# Patient Record
Sex: Female | Born: 1990 | Hispanic: Yes | Marital: Single | Smoking: Never smoker
Health system: Southern US, Community
[De-identification: ages and names within clinical notes are randomized; demographics above are authoritative.]

## PROBLEM LIST (undated history)

## (undated) DIAGNOSIS — O24419 Gestational diabetes mellitus in pregnancy, unspecified control: Secondary | ICD-10-CM

## (undated) HISTORY — DX: Gestational diabetes mellitus in pregnancy, unspecified control: O24.419

---

## 2014-12-03 ENCOUNTER — Other Ambulatory Visit (HOSPITAL_COMMUNITY): Payer: Self-pay | Admitting: Nurse Practitioner

## 2014-12-03 DIAGNOSIS — Z0489 Encounter for examination and observation for other specified reasons: Secondary | ICD-10-CM

## 2014-12-03 DIAGNOSIS — IMO0002 Reserved for concepts with insufficient information to code with codable children: Secondary | ICD-10-CM

## 2014-12-03 LAB — OB RESULTS CONSOLE GC/CHLAMYDIA
CHLAMYDIA, DNA PROBE: NEGATIVE
Gonorrhea: NEGATIVE

## 2014-12-03 LAB — OB RESULTS CONSOLE PLATELET COUNT: PLATELETS: 221 10*3/uL

## 2014-12-03 LAB — OB RESULTS CONSOLE HGB/HCT, BLOOD
HCT: 39 %
HEMOGLOBIN: 12.3 g/dL

## 2014-12-03 LAB — OB RESULTS CONSOLE RPR: RPR: NONREACTIVE

## 2014-12-03 LAB — GLUCOSE TOLERANCE, 1 HOUR (50G) W/O FASTING: GLUCOSE 1 HOUR GTT: 199 mg/dL (ref ?–200)

## 2014-12-03 LAB — OB RESULTS CONSOLE RUBELLA ANTIBODY, IGM: RUBELLA: IMMUNE

## 2014-12-03 LAB — OB RESULTS CONSOLE HIV ANTIBODY (ROUTINE TESTING): HIV: NONREACTIVE

## 2014-12-03 LAB — OB RESULTS CONSOLE ABO/RH: RH Type: POSITIVE

## 2014-12-03 LAB — OB RESULTS CONSOLE ANTIBODY SCREEN: Antibody Screen: NEGATIVE

## 2014-12-03 LAB — CYTOLOGY - PAP: Pap: NEGATIVE

## 2014-12-03 LAB — OB RESULTS CONSOLE HEPATITIS B SURFACE ANTIGEN: HEP B S AG: NEGATIVE

## 2014-12-03 LAB — SICKLE CELL SCREEN: SICKLE CELL SCREEN: NORMAL

## 2014-12-04 ENCOUNTER — Ambulatory Visit (HOSPITAL_COMMUNITY)
Admission: RE | Admit: 2014-12-04 | Discharge: 2014-12-04 | Disposition: A | Discharge status: Home / Self Care | Payer: Self-pay | Source: Ambulatory Visit | Attending: Nurse Practitioner | Admitting: Nurse Practitioner

## 2014-12-04 DIAGNOSIS — IMO0002 Reserved for concepts with insufficient information to code with codable children: Secondary | ICD-10-CM

## 2014-12-04 DIAGNOSIS — O34219 Maternal care for unspecified type scar from previous cesarean delivery: Secondary | ICD-10-CM | POA: Insufficient documentation

## 2014-12-04 DIAGNOSIS — Z36 Encounter for antenatal screening of mother: Secondary | ICD-10-CM | POA: Insufficient documentation

## 2014-12-04 DIAGNOSIS — Z3A19 19 weeks gestation of pregnancy: Secondary | ICD-10-CM | POA: Insufficient documentation

## 2014-12-04 DIAGNOSIS — Z0489 Encounter for examination and observation for other specified reasons: Secondary | ICD-10-CM

## 2014-12-04 DIAGNOSIS — Z3689 Encounter for other specified antenatal screening: Secondary | ICD-10-CM | POA: Insufficient documentation

## 2014-12-17 ENCOUNTER — Encounter: Payer: Self-pay | Attending: Advanced Practice Midwife | Admitting: *Deleted

## 2014-12-17 ENCOUNTER — Ambulatory Visit (INDEPENDENT_AMBULATORY_CARE_PROVIDER_SITE_OTHER): Payer: Self-pay | Admitting: *Deleted

## 2014-12-17 VITALS — Ht 61.63 in | Wt 189.5 lb

## 2014-12-17 DIAGNOSIS — Z3A00 Weeks of gestation of pregnancy not specified: Secondary | ICD-10-CM | POA: Insufficient documentation

## 2014-12-17 DIAGNOSIS — E119 Type 2 diabetes mellitus without complications: Secondary | ICD-10-CM | POA: Insufficient documentation

## 2014-12-17 DIAGNOSIS — O24912 Unspecified diabetes mellitus in pregnancy, second trimester: Secondary | ICD-10-CM | POA: Insufficient documentation

## 2014-12-17 DIAGNOSIS — Z6835 Body mass index (BMI) 35.0-35.9, adult: Secondary | ICD-10-CM | POA: Insufficient documentation

## 2014-12-17 DIAGNOSIS — O24410 Gestational diabetes mellitus in pregnancy, diet controlled: Secondary | ICD-10-CM

## 2014-12-17 DIAGNOSIS — Z713 Dietary counseling and surveillance: Secondary | ICD-10-CM | POA: Insufficient documentation

## 2014-12-17 LAB — POCT URINALYSIS DIP (DEVICE)
Bilirubin Urine: NEGATIVE
GLUCOSE, UA: NEGATIVE mg/dL
Ketones, ur: NEGATIVE mg/dL
NITRITE: NEGATIVE
Protein, ur: 30 mg/dL — AB
Specific Gravity, Urine: >=1.03 (ref 1.005–1.030)
Urobilinogen, UA: 0.2 mg/dL (ref 0.0–1.0)
pH: 5.5 (ref 5.0–8.0)

## 2014-12-17 NOTE — Progress Notes (Signed)
  Patient was seen on 12/17/14 for Gestational Diabetes self-management . The following learning objectives were met by the patient :   States the definition of Gestational Diabetes  States when to check blood glucose levels  Demonstrates proper blood glucose monitoring techniques  States the effect of stress and exercise on blood glucose levels  Plan:  Aim for 2 Carb Choices per meal (30 grams) +/- 1 either way for breakfast Aim for 3 Carb Choices per meal (45 grams) +/- 1 either way from lunch and dinner Aim for 1-2 Carbs per snack Begin reading food labels for Total Carbohydrate and sugar grams of foods Consider  increasing your activity level by walking daily as tolerated Begin checking BG before breakfast and 2 hours after first bit of breakfast, lunch and dinner after  as directed by MD  Take medication  as directed by MD  Blood glucose monitor given: True Track  Blood glucose reading: 120 2hpp  Patient instructed to monitor glucose levels: FBS: 60 - <90 2 hour: <120  Patient received the following handouts:  Nutrition Diabetes and Pregnancy  Patient will be seen for follow-up as needed.

## 2014-12-17 NOTE — Progress Notes (Signed)
Nutrition note: GDM diet education Pt was recently diagnosed with GDM & has h/o obesity. Pt has lost 0.5# @ 25wks (prepregancy wt: 190#) While discussing GDM diet, discovered that pt cannot read so interpreter helped by using pictures to help pt to know how many CHO servings she can have at each meal. Pt reports eating 2 meals/d & snacks occ. Pt reports no N&V but has some heartburn. NKFA. Pt reports no walking or physical activity. Pt received verbal & written education in Spanish via an interpreter about GDM diet. Discussed tips to decrease heartburn. Discussed wt gain goals of 11-20# or 0.5#/wk. Pt agrees to follow GDM diet with 3 meals & 1-3 snacks/d with proper CHO/ protein combination. Pt has WIC & plans to BF. F/u in 2-4 wks Blondell RevealLaura Ura Hausen, MS, RD, LDN, [facility]

## 2014-12-18 ENCOUNTER — Encounter: Payer: Self-pay | Admitting: *Deleted

## 2014-12-24 ENCOUNTER — Ambulatory Visit (INDEPENDENT_AMBULATORY_CARE_PROVIDER_SITE_OTHER): Payer: Self-pay | Admitting: Advanced Practice Midwife

## 2014-12-24 ENCOUNTER — Encounter: Payer: Self-pay | Admitting: Advanced Practice Midwife

## 2014-12-24 VITALS — BP 113/64 | HR 68 | Temp 98.4°F | Wt 188.7 lb

## 2014-12-24 DIAGNOSIS — O34219 Maternal care for unspecified type scar from previous cesarean delivery: Secondary | ICD-10-CM

## 2014-12-24 DIAGNOSIS — O24912 Unspecified diabetes mellitus in pregnancy, second trimester: Secondary | ICD-10-CM

## 2014-12-24 DIAGNOSIS — O3421 Maternal care for scar from previous cesarean delivery: Secondary | ICD-10-CM

## 2014-12-24 DIAGNOSIS — Z550 Illiteracy and low-level literacy: Secondary | ICD-10-CM

## 2014-12-24 DIAGNOSIS — R4789 Other speech disturbances: Secondary | ICD-10-CM

## 2014-12-24 DIAGNOSIS — N841 Polyp of cervix uteri: Secondary | ICD-10-CM

## 2014-12-24 DIAGNOSIS — Z3492 Encounter for supervision of normal pregnancy, unspecified, second trimester: Secondary | ICD-10-CM

## 2014-12-24 DIAGNOSIS — IMO0001 Reserved for inherently not codable concepts without codable children: Secondary | ICD-10-CM

## 2014-12-24 DIAGNOSIS — O24913 Unspecified diabetes mellitus in pregnancy, third trimester: Secondary | ICD-10-CM | POA: Insufficient documentation

## 2014-12-24 DIAGNOSIS — O09892 Supervision of other high risk pregnancies, second trimester: Secondary | ICD-10-CM

## 2014-12-24 LAB — POCT URINALYSIS DIP (DEVICE)
Bilirubin Urine: NEGATIVE
Glucose, UA: NEGATIVE mg/dL
Ketones, ur: NEGATIVE mg/dL
Nitrite: POSITIVE — AB
Protein, ur: NEGATIVE mg/dL
Specific Gravity, Urine: 1.025 (ref 1.005–1.030)
Urobilinogen, UA: 0.2 mg/dL (ref 0.0–1.0)
pH: 6 (ref 5.0–8.0)

## 2014-12-24 MED ORDER — GLYBURIDE 2.5 MG PO TABS
2.5000 mg | ORAL_TABLET | Freq: Two times a day (BID) | ORAL | Status: DC
Start: 2014-12-24 — End: 2015-01-28

## 2014-12-24 NOTE — Progress Notes (Signed)
Nutrition note: GDM diet f/u Pt has lost 1.3# @ 287w5d. Pt has been checking her BS- fasting: 79-95, 2hr pp: 96-164. Pt reports that she has noticed sometimes when her BS are high, she can think back & she ate more than she should have of CHO foods but other times she eats like we discussed last week and they're still high. Pt stated she has been measuring her foods (ie, rice & beans) but when asked which measuring cup was for rice, pt pointed to 1/2c instead of 1/3c as was discussed last week. Pt is starting Glyburide today. Interpreter helped review GDM diet & correct portion sizes. Pt reports no other questions. F/u in 2-4 wks Blondell RevealLaura Nycole Kawahara, MS, RD, LDN, [facility]

## 2014-12-24 NOTE — Progress Notes (Signed)
Initial [facility] appointment[facility] Rebecca Holder Spanish Interpreter Breastfeeding tip of the week reviewed Pt states she is very itchy Hgb: trace, Nitrites: positive, Leukocytes: small

## 2014-12-24 NOTE — Patient Instructions (Addendum)
Cortisone cream for itching. If you have itching and pealing between your toes got medicine for athelete's foot.   Type 1 or Type 2 Diabetes Mellitus During Pregnancy Diabetes mellitus, often simply referred to as diabetes, is a long-term (chronic) disease. Type 1 diabetes occurs when the islet cells, which are in the pancreas and make the hormone insulin, are destroyed and can no longer make insulin. Type 2 diabetes occurs when the pancreas does not make enough insulin, the cells are less responsive to the insulin that is made (insulin resistance), or both. Insulin is needed to move sugars from food into the tissue cells. The tissue cells use the sugars for energy. The lack of insulin or the lack of normal response to insulin causes excess sugars to build up in the blood instead of going into the tissue cells. As a result, high blood sugar (hyperglycemia) develops.  If blood glucose levels are kept in the normal range both before and during pregnancy, women can have a healthy pregnancy. If your blood glucose levels are not well controlled, there may be risks to you, your unborn baby, and your labor and delivery. Also, there may be risks to your baby once he or she is born.  RISK FACTORS  You are predisposed to developing type 1 diabetes if someone in your family has diabetes and you are exposed to certain environmental triggers.  You have an increased chance of developing type 2 diabetes if you have a family history of diabetes and also have one or more of the following risk factors:  Being overweight.  Having an inactive lifestyle.  Having a history of consistently eating high-calorie foods. SYMPTOMS  Increased thirst (polydipsia).  Increased urination (polyuria).  Increased urination during the night (nocturia).  Weight loss. This weight loss may be rapid.  Frequent, recurring infections.  Tiredness (fatigue).  Weakness.  Vision changes, such as blurred vision.  Fruity smell to  your breath.  Abdominal pain.  Nausea or vomiting. DIAGNOSIS  Diabetes is diagnosed when blood glucose levels are increased. Your blood glucose level may be checked by one or more of the following blood tests:  A fasting blood glucose test. You will not be allowed to eat for at least 8 hours before a blood sample is taken.  A random blood glucose test. Your blood glucose is checked at any time of the day regardless of when you ate.  A hemoglobin A1c blood glucose test. A hemoglobin A1c test provides information about blood glucose control over the previous 3 months.  An oral glucose tolerance test (OGTT). Your blood glucose is measured after you have not eaten (fasted) for 1-3 hours and then after you drink a glucose-containing beverage. An OGTT is usually performed during weeks 24-28 of your pregnancy. TREATMENT   You will need to take diabetes medicine or insulin daily to keep blood glucose levels in the desired range.  You will need to match insulin dosing with exercise and healthy food choices. The treatment goal is to maintain the before-meal (preprandial), bedtime, and overnight blood glucose level at 60-99 mg/dL during pregnancy. The treatment goal is to further maintain the peak after-meal blood sugar (postprandial glucose) level at 100-140 mg/dL.  HOME CARE INSTRUCTIONS   Have your hemoglobin A1c level checked twice a year.  Perform daily blood glucose monitoring as directed by your health care provider. It is common to perform frequent blood glucose monitoring.  Monitor urine ketones when you are sick and as directed by your health care  provider.  Take your diabetes medicine and insulin as directed by your health care provider to maintain your blood glucose level in the desired range.  Never run out of diabetes medicine or insulin. It is needed every day.  Adjust insulin based on your intake of carbohydrates. Carbohydrates can raise blood glucose levels but need to be  included in your diet. Carbohydrates provide vitamins, minerals, and fiber, which are an essential part of a healthy diet. Carbohydrates are found in fruits, vegetables, whole grains, dairy products, legumes, and foods containing added sugars.  Eat healthy foods. Alternate 3 meals with 3 snacks.  Maintain a healthy weight gain. The usual total expected weight gain varies according to your prepregnancy body mass index (BMI).  Carry a medical alert card or wear medical alert jewelry.  Carry a 15-gram carbohydrate snack with you at all times to treat low blood sugar (hypoglycemia). Some examples of 15-gram carbohydrate snacks include:  Glucose tablets, 3 or 4.  Glucose gel, 15-gram tube.  Raisins, 2 Tbsp (24 grams).  Jelly beans, 6.  Animal crackers, 8.  Fruit juice, regular soda, or low-fat milk, 4 ounces (120 mL).  Gummy treats, 9.  Recognize hypoglycemia. Hypoglycemia during pregnancy occurs with blood glucose levels of 60 mg/dL and below. The risk for hypoglycemia increases when fasting or skipping meals, during or after intense exercise, and during sleep. Hypoglycemia symptoms can include:  Tremors or shakes.  Decreased ability to concentrate.  Sweating.  Increased heart rate.  Headache.  Dry mouth.  Hunger.  Irritability.  Anxiety.  Restless sleep.  Altered speech or coordination.  Confusion.  Treat hypoglycemia promptly. If you are alert and able to safely swallow, follow the 15:15 rule:  Take 15-20 grams of rapid-acting glucose or carbohydrate. Rapid-acting options include glucose gel, glucose tablets, or 4 ounces (120 mL) of fruit juice, regular soda, or low-fat milk.  Check your blood glucose level 15 minutes after taking the glucose.  Take an additional 15-20 grams of glucose if the repeat blood glucose level is still 70 mg/dL or below.  Eat a meal or snack within 1 hour once blood glucose levels return to normal.  Engage in at least 30 minutes of  physical activity a day or as directed by your health care provider. Ten minutes of physical activity timed 30 minutes after each meal is encouraged to control postprandial blood glucose levels.  Watch for polyuria (excess urination) and polydipsia (feeling extra thirsty), which are early signs of hyperglycemia. An early awareness of hyperglycemia allows for prompt treatment. Treat hyperglycemia as directed by your health care provider.  Adjust your insulin dosing and food intake, as needed, if you start a new exercise or sport.  Follow your sick-day plan any time you are unable to eat or drink as usual.  Avoid tobacco and alcohol use.  Keep all follow-up visits as directed by your health care provider.  Follow the advice of your health care provider regarding your prenatal and post-delivery (postpartum) appointments, meal planning, exercise, medicines, vitamins, blood tests, other medical tests, and physical activities.  Continue daily skin and foot care. Examine your skin and feet daily for cuts, bruises, redness, nail problems, bleeding, blisters, or sores. A foot exam by a health care provider should be done annually.  Brush your teeth and gums at least twice a day and floss at least once a day. Follow up with your dentist regularly.  Schedule an eye exam during the first trimester of your pregnancy or as directed by  your health care provider.  Share your diabetes management plan with your workplace or school.  Stay up-to-date with immunizations.  Learn to manage stress.  Obtain ongoing diabetes education and support as needed.  Your health care provider may recommend that you take one low-dose aspirin (81 mg) each day to help prevent high blood pressure during your pregnancy (preeclampsia or eclampsia). You may be at risk for preeclampsia or eclampsia if:  You had preeclampsia or eclampsia during a previous pregnancy.  Your baby did not grow as expected during a previous  pregnancy.  You experienced preterm birth with a previous pregnancy.  You experienced a separation of the placenta from the uterus (placental abruption) during a previous pregnancy.  You experienced the loss of your baby during a previous pregnancy.  You are pregnant with more than one baby.  You have other medical conditions, such as high blood pressure or autoimmune disease. SEEK MEDICAL CARE IF:   You are unable to eat food or drink fluids for more than 6 hours.  You have nausea and vomiting for more than 6 hours.  You have a blood glucose level of 200 mg/dL and you have ketones in your urine.  There is a change in mental status.  You develop vision problems.  You have a persistent headache.  You have upper abdominal pain or discomfort.  You have an additional serious sickness.  You have diarrhea for more than 6 hours.  You have been sick or have had a fever for 2 days and are not getting better. SEEK IMMEDIATE MEDICAL CARE IF:  You have difficulty breathing.  You no longer feel your baby moving.  You are bleeding or have discharge from your vagina.  You start having premature contractions or labor. MAKE SURE YOU:  Understand these instructions.  Will watch your condition.  Will get help right away if you are not doing well or get worse. Document Released: 02/17/2012 Document Revised: 10/09/2013 Document Reviewed: 02/17/2012 [facility] Patient Information 2015 [employer] [place]. This information is not intended to replace advice given to you by your health care provider. Make sure you discuss any questions you have with your health care provider.

## 2014-12-26 DIAGNOSIS — Z550 Illiteracy and low-level literacy: Secondary | ICD-10-CM | POA: Insufficient documentation

## 2014-12-26 DIAGNOSIS — N841 Polyp of cervix uteri: Secondary | ICD-10-CM | POA: Insufficient documentation

## 2014-12-26 DIAGNOSIS — O09899 Supervision of other high risk pregnancies, unspecified trimester: Secondary | ICD-10-CM | POA: Insufficient documentation

## 2014-12-26 DIAGNOSIS — Z789 Other specified health status: Secondary | ICD-10-CM | POA: Insufficient documentation

## 2014-12-26 DIAGNOSIS — IMO0001 Reserved for inherently not codable concepts without codable children: Secondary | ICD-10-CM | POA: Insufficient documentation

## 2014-12-26 NOTE — Progress Notes (Signed)
   Subjective:    Rebecca Holder is a G2P1001 2439w0d being seen today [facility]. Started blood sugar testing.  Her obstetrical history is significant for obesity and C/S. Was told "baby couldn't fit". 6 or 7 lb.  Patient does intend to breast feed. Pregnancy history fully reviewed.  Patient reports no complaints.  Filed Vitals:   12/24/14 1105  BP: 113/64  Pulse: 68  Temp: 98.4 F (36.9 C)  Weight: 188 lb 11.2 oz (85.594 kg)    HISTORY: OB History  Gravida Para Term Preterm AB SAB TAB Ectopic Multiple Living  2 1 1  0 0 0 0 0 0 1    # Outcome Date GA Lbr Len/2nd Weight Sex Delivery Anes PTL Lv  2 Current           1 Term 03/25/07 52103w0d  7 lb (3.175 kg)  CS-Unspec  N      Past Medical History  Diagnosis Date  . Gestational diabetes    Past Surgical History  Procedure Laterality Date  . Cesarean section     No family history on file.  Exam  Fasting CBGs 1/2 abnormal PC CBG's, > 1/2 > 120   Uterus:   22 cm  Pelvic Exam: Exam deferred                           Bony Pelvis: unproven  System: Breast:  deferred   Skin: normal coloration and turgor, no rashes    Neurologic: oriented, normal, gait normal; reflexes normal and symmetric, grossly non-focal   Extremities: normal strength, tone, and muscle mass, no edema   HEENT sclera clear, anicteric, neck supple with midline trachea and thyroid without masses   Mouth/Teeth mucous membranes moist, pharynx normal without lesions and dental hygiene good   Neck supple and no masses   Cardiovascular: regular rate and rhythm, no murmurs or gallops   Respiratory:  appears well, vitals normal, no respiratory distress, acyanotic, normal RR, chest clear, no wheezing, crepitations, rhonchi, normal symmetric air entry   Abdomen: soft, non-tender; bowel sounds normal; no masses,  no organomegaly and gravid, S=D   Urinary: deferred      Assessment:    Pregnancy: G2P1001 Patient Active Problem List   Diagnosis Date Noted  . Diabetes mellitus affecting pregnancy in second trimester, antepartum 12/24/2014  . [redacted] weeks gestation of pregnancy   . Encounter for fetal anatomic survey   . Previous cesarean delivery, antepartum         Plan:     Initial labs reviewed. Prenatal vitamins. Problem list reviewed and updated. Genetic Screening discussed Quad Screen: ordered  Ultrasound discussed; fetal survey: ordered.  Follow up in 1 weeks. Start Glyburide 2.5 mg PO BID.  F/U 1 week to check CBGs  Dorathy KinsmanSMITH, Pasqualino Witherspoon 12/26/2014

## 2014-12-27 LAB — AFP, QUAD SCREEN
AFP: 80.7 ng/mL
Age Alone: 1:978 {titer}
CURR GEST AGE: 21.6 wks.days
HCG, Total: 3.22 IU/mL
INH: 158.5 pg/mL
Interpretation-AFP: NEGATIVE
MOM FOR HCG: 0.22
MoM for AFP: 1.27
MoM for INH: 1.02
Open Spina bifida: NEGATIVE
Osb Risk: 1:5570 {titer}
TRI 18 SCR RISK EST: NEGATIVE
uE3 Mom: 1.35
uE3 Value: 3.55 ng/mL

## 2015-01-10 ENCOUNTER — Ambulatory Visit
Admission: RE | Admit: 2015-01-10 | Discharge: 2015-01-10 | Disposition: A | Discharge status: Home / Self Care | Payer: No Typology Code available for payment source | Source: Ambulatory Visit | Attending: Infectious Disease | Admitting: Infectious Disease

## 2015-01-10 ENCOUNTER — Other Ambulatory Visit: Payer: Self-pay | Admitting: Infectious Disease

## 2015-01-10 DIAGNOSIS — R7611 Nonspecific reaction to tuberculin skin test without active tuberculosis: Secondary | ICD-10-CM

## 2015-01-14 ENCOUNTER — Ambulatory Visit (INDEPENDENT_AMBULATORY_CARE_PROVIDER_SITE_OTHER): Payer: Self-pay | Admitting: Obstetrics & Gynecology

## 2015-01-14 ENCOUNTER — Telehealth: Payer: Self-pay | Admitting: General Practice

## 2015-01-14 VITALS — BP 111/74 | HR 78 | Temp 98.0°F | Wt 187.3 lb

## 2015-01-14 DIAGNOSIS — O24912 Unspecified diabetes mellitus in pregnancy, second trimester: Secondary | ICD-10-CM

## 2015-01-14 DIAGNOSIS — O3421 Maternal care for scar from previous cesarean delivery: Secondary | ICD-10-CM

## 2015-01-14 DIAGNOSIS — O34219 Maternal care for unspecified type scar from previous cesarean delivery: Secondary | ICD-10-CM

## 2015-01-14 LAB — POCT URINALYSIS DIP (DEVICE)
Bilirubin Urine: NEGATIVE
Glucose, UA: 100 mg/dL — AB
HGB URINE DIPSTICK: NEGATIVE
NITRITE: NEGATIVE
Protein, ur: 30 mg/dL — AB
Specific Gravity, Urine: 1.025 (ref 1.005–1.030)
Urobilinogen, UA: 1 mg/dL (ref 0.0–1.0)
pH: 6 (ref 5.0–8.0)

## 2015-01-14 MED ORDER — ASPIRIN EC 81 MG PO TBEC: 81.0000 mg | DELAYED_RELEASE_TABLET | Freq: Every day | ORAL | Status: DC

## 2015-01-14 NOTE — Progress Notes (Signed)
Fetal echo scheduled 9/15

## 2015-01-14 NOTE — Telephone Encounter (Signed)
Patient left clinic before fetal echo appt was made. appt made for 9/15 @ 10am at [facility]. Called patient with [employer]arl Pikes for interpreter, no answer- left message stating we are trying to reach you in regards to an appt we have set up, please call us back at the clinics

## 2015-01-14 NOTE — Progress Notes (Signed)
Counseled about TOLAC  Subjective:brought her BG book  Rebecca Holder is a 24 y.o. G2P1001 at [redacted]w[redacted]d being seen today for ongoing prenatal care.  Patient reports no complaints.  Contractions: Not present.  Vag. Bleeding: None. Movement: Present. Denies leaking of fluid.   The following portions of the patient's history were reviewed and updated as appropriate: allergies, current medications, past family history, past medical history, past social history, past surgical history and problem list.   Objective:   Filed Vitals:   01/14/15 1131  BP: 111/74  Pulse: 78  Temp: 98 F (36.7 C)  Weight: 187 lb 4.8 oz (84.959 kg)    Fetal Status: Fetal Heart Rate (bpm): 147   Movement: Present     General:  Alert, oriented and cooperative. Patient is in no acute distress.  Skin: Skin is warm and dry. No rash noted.   Cardiovascular: Normal heart rate noted  Respiratory: Normal respiratory effort, no problems with respiration noted  Abdomen: Soft, gravid, appropriate for gestational age. Pain/Pressure: Absent     Pelvic: Vag. Bleeding: None     Cervical exam deferred        Extremities: Normal range of motion.  Edema: None  Mental Status: Normal mood and affect. Normal behavior. Normal judgment and thought content.   Urinalysis:      Assessment and Plan:  Pregnancy: G2P1001 at [redacted]w[redacted]d  1. Diabetes mellitus affecting pregnancy in second trimester, antepartum FBS most < 90, on PP 288 O/W <140, continue glyburide  Needs fetal echocardiogram. ASA 81 mg  [place] in 1 week 2. Previous cesarean delivery, antepartum TOLAC info given  Preterm labor symptoms and general obstetric precautions including but not limited to vaginal bleeding, contractions, leaking of fluid and fetal movement were reviewed in detail with the patient. Please refer to After Visit Summary for other counseling recommendations.  2 weeks f/u  Adam Phenix, MD

## 2015-01-14 NOTE — Patient Instructions (Addendum)
Diabetes mellitus tipo 1 o tipo 2 durante el embarazo (Type 1 or Type 2 Diabetes Mellitus During Pregnancy) La diabetes mellitus, generalmente denominada diabetes, es una enfermedad prolongada (crnica). La diabetes tipo1 ocurre cuando las clulas de los islotes, que estn en el pncreas y producen la hormona [place], se destruyen y ya no pueden producir insulina. La diabetes tipo2 ocurre cuando el pncreas no produce suficiente insulina, las clulas son menos sensibles a la insulina que se produce (resistencia a la insulina), o ambos. La insulina es necesaria para movilizar los azcares de los alimentos a las clulas de los tejidos. Las clulas de los tejidos [employer] azcares para Dealer. La falta de insulina o la falta de una respuesta normal a la insulina hace que el exceso de azcar se acumule en la sangre en lugar de Location manager en las clulas de los tejidos. Como resultado, se producen niveles altos de Dispensing optician (hiperglucemia).  Si se mantienen los niveles de glucosa en la sangre en un rango normal antes y Doctor, general practice [place], las mujeres pueden tener un embarazo saludable. Si no se controlan los niveles de glucosa en la sangre [place], puede haber riesgos para usted, para el beb que no ha nacido y [place] de parto y [place]. Tambin puede haber riesgos para el beb cuando nazca.  FACTORES DE RIESGO   Una persona est predispuesta a desarrollar diabetes tipo 1 si alguien en su familia padece la enfermedad y se expone a ciertos desencadenantes ambientales adicionales.  Tiene una mayor probabilidad de desarrollar diabetes tipo 2 si tiene antecedentes familiares de diabetes y tambin tiene uno o ms de los siguientes factores de riesgo:  Tener sobrepeso.  Tiene un estilo de vida sedentario.  Ha consumido, a lo [place] de toda su vida, una gran cantidad de alimentos con muchas caloras. SNTOMAS  Aumento de la sed (polidipsia).  Aumento de la miccin  (poliuria).  Orina con ms frecuencia durante la noche (nocturia).  Prdida de peso. La prdida de peso puede ser muy rpida.  Infecciones frecuentes y recurrentes.  Cansancio (fatiga).  Debilidad.  Cambios en la visin, como visin borrosa.  Olor a Medical illustrator.  Dolor abdominal.  Nuseas o vmitos. DIAGNSTICO  La diabetes se diagnostica cuando hay aumento de los niveles de glucosa en la [place]. El nivel de glucosa en la sangre puede controlarse en uno o ms de los siguientes anlisis de sangre:  Medicin de glucosa en la sangre en [place]. No se le permitir comer durante al menos 8 horas antes de que se tome [place] de [place].  Pruebas al azar de glucosa en la sangre. El nivel de glucosa en la sangre se controla en cualquier momento del da sin importar el momento en que haya comido.  Prueba de A1c (hemoglobina glucosilada) Una prueba de A1c proporciona informacin sobre el control de la glucosa en la sangre durante los ltimos 3 meses.  Prueba de tolerancia a la glucosa oral (PTGO). La glucosa en la sangre se mide despus de no haber comido (ayunas) durante una a tres horas y despus de beber una bebida que contenga glucosa. La prueba de tolerancia a la glucosa oral se [employer] 24 a 28 del Media planner. TRATAMIENTO   Usted tendr que tomar medicamentos para la diabetes o insulina diariamente para Theatre manager los niveles de glucosa en la sangre en el rango deseado.  Usted tendr [employer] dosis de insulina con la actividad fsica y  la eleccin de alimentos saludables. El objetivo del tratamiento es mantener el nivel de azcar en la sangre previo a comer (preprandial) y durante la noche entre 39 y 99mg /dl, durante todo el [place]. El objetivo del tratamiento es mantener el mayor nivel de azcar en la sangre despus de comer (glucosa pospandial) entre 100 y 140mg /dl.  INSTRUCCIONES PARA EL CUIDADO EN EL HOGAR   Controle su nivel de hemoglobina A1c  dos veces al ao.  Contrlese a diario el nivel de glucosa en la sangre segn las indicaciones de su mdico. Es comn Optometrist controles frecuentes de la glucosa en la [place].  Supervise las cetonas en la orina cuando est enfermo y segn las indicaciones de su [place] medicamento para la diabetes y adminstrese insulina segn las indicaciones de su mdico para Contractor nivel de glucosa en la sangre en el rango deseado.  Nunca se quede sin medicamento para la diabetes o sin insulina. Es necesario que la reciba [employer].  Ajuste la insulina segn la ingesta de hidratos de carbono. Los hidratos de carbono pueden aumentar los niveles de glucosa en la sangre, pero deben incluirse en su dieta. Aportan vitaminas, minerales y [place] que son Ardelia Mems parte esencial de una dieta saludable. Los hidratos de carbono se encuentran en frutas, verduras, cereales integrales, productos lcteos, legumbres y alimentos que contienen azcares aadidos.  Consuma alimentos saludables. Alterne 3 comidas con 3 colaciones.  Aumente de peso saludablemente. El aumento del peso total vara de acuerdo con el ndice de masa corporal que tena antes del embarazo [facility]).  Lleve una tarjeta de alerta mdica o use un brazalete o medalla de alerta mdica.  Lleve con usted una colacin de 15gramos de carbohidratos en todo momento para controlar los niveles bajos de glucosa en la sangre (hipoglucemia). Algunos ejemplos de colaciones de 15gramos de hidratos de carbono son los siguientes:  Tabletas de glucosa, 3 o 4.  Gel de glucosa, tubo de 15 gramos.  Pasas de uva, 2cucharadas (24gramos).  Caramelos de goma, 6.  Galletas de [place], 8.  Jugo de fruta, gaseosa comn, o [place], 4 onzas (120 ml).  Pastillas de goma, 9.  Reconocer la hipoglucemia. Durante el embarazo la hipoglucemia se produce cuando hay niveles de glucosa en la sangre de 60 mg/dl o menos. El riesgo de hipoglucemia aumenta durante  el ayuno o cuando se saltea las comidas, durante o despus de Optometrist ejercicio intenso y [place] duerme. Los sntomas de hipoglucemia son:  Temblores o sacudidas.  Disminucin de la capacidad de concentracin.  Sudoracin.  Aumento de la frecuencia cardaca.  Dolor de [place].  Sequedad en la boca.  Hambre.  Irritabilidad.  Ansiedad.  Sueo agitado.  Alteracin del habla o de la coordinacin.  Confusin.  Tratar la hipoglucemia rpidamente. Si usted est alerta y puede tragar con seguridad, siga la regla de 15/15 que consiste en:  [employer] 15 y 20gramos de glucosa de accin rpida o carbohidratos. Las opciones de accin rpida son un gel de glucosa, tabletas de glucosa, o 4 onzas (120 ml) de jugo de frutas, gaseosa comn, o leche baja en grasa.  Compruebe su nivel de glucosa en la sangre 15 minutos despus de tomar la glucosa.  Tome entre 15 y 20gramos ms de glucosa si el nivel de glucosa en la sangre todava es de 70mg /dl o inferior.  Ingiera una comida o una colacin en el lapso de 1 hora una vez que los niveles de glucosa en la [place]  vuelven a la normalidad.  Haga actividad fsica por lo menos 30minutos al da o como lo indique su mdico. Se recomienda que 30 minutos despus de cada comida, realice diez minutos de actividad fsica para controlar los niveles de glucosa postprandial en la sangre.  Est alerta a la poliuria (miccin excesiva) y la polidipsia (sensacin de mucha sed), que son los primeros signos de la hiperglucemia. El reconocimiento temprano de la hiperglucemia permite un tratamiento oportuno. Trate la hiperglucemia segn le indic su mdico.  Ajuste su dosis de insulina y la ingesta de alimentos, segn sea necesario, si inicia un nuevo ejercicio o deporte.  Siga su plan para los das de enfermedad cuando no puede comer o beber como de costumbre.  Evite el tabaco y el alcohol.  Concurra a todas las visitas de control como se lo haya indicado el  mdico.  Siga el consejo del mdico respecto a los controles prenatales y posteriores al parto (postparto), las visitas, la planificacin de las comidas, el ejercicio, los medicamentos, las vitaminas, los anlisis de sangre, otras pruebas mdicas y actividades fsicas.  Cuide diariamente la piel y los pies. Examine su piel y los pies diariamente para ver si tiene cortes, moretones, enrojecimiento, problemas en las uas, sangrado, ampollas o llagas. Su mdico debe hacerle un examen de los pies una vez por ao.  Cepllese los dientes y encas por lo menos dos veces al da y use hilo dental al menos una vez por da. Concurra regularmente a las visitas de control con el dentista.  Programe un examen de vista durante el primer trimestre de su embarazo o como lo indique su mdico.  Comparta su plan de control de diabetes en el trabajo o en la escuela.  Mantngase al da con las vacunas.  Aprenda a manejar el estrs.  Obtenga la mayor cantidad posible de informacin sobre la diabetes y solicite ayuda siempre que sea necesario.  Su mdico puede recomendarle que tome una aspirina de dosis baja (81mg) cada da a fin de ayudar a prevenir la hipertensin durante el embarazo (preeclampsia o eclampsia). Puede estar en riesgo de padecer preeclampsia o eclampsia si:  Padeci preeclampsia o eclampsia durante un embarazo anterior.  Su beb no creci segn lo previsto durante un embarazo anterior.  Tuvo un parto prematuro en un embarazo anterior.  Experiment una separacin de la placenta desde el tero (desprendimiento abrupto de la placenta) durante un embarazo anterior.  Perdi un beb en un embarazo anterior.  Est embarazada de ms de un beb.  Padece otras afecciones mdicas, como hipertensin arterial o una enfermedad autoinmunitaria. SOLICITE ATENCIN MDICA SI:   No puede comer alimentos o beber por ms de 6 horas.  Tuvo nuseas o ha vomitado durante ms de 6 horas.  Tiene un nivel de  glucosa en la sangre de 200 mg/dl y cetonas en la orina.  Presenta algn cambio en el estado mental.  Desarrolla problemas de visin.  Sufre un dolor persistente de cabeza.  Siente dolor o molestias en la parte superior del abdomen.  Tiene una enfermedad grave adicional.  Tuvo diarrea durante ms de 6 horas.  Ha estado enfermo o ha tenido fiebre durante 2 das y no mejora. SOLICITE ATENCIN MDICA DE INMEDIATO SI:  Tiene dificultad para respirar.  Ya no siente los movimientos del beb.  Est sangrando o tiene flujo vaginal.  Comienza a tener contracciones o trabajo de parto prematuro. ASEGRESE DE QUE:  Comprende estas instrucciones.  Controlar su afeccin.  Recibir ayuda de   inmediato si no mejora o si empeora. Document Released: 02/17/2012 Document Revised: 10/09/2013 [facility] Patient Information 2015 [place], [place]. This information is not intended to replace advice given to you by your health care provider. Make sure you discuss any questions you have with your health care provider. Parto vaginal despus de Eustace Quail (Vaginal Birth After Cesarean Delivery) Un parto vaginal despus de un parto por cesrea es dar a luz por la vagina despus de haber dado a luz por medio de una intervencin [place]. En el pasado, si una mujer tena un beb por cesrea, todos los partos posteriores deban hacerse por cesrea. Esto ya no es as. Puede ser seguro para la mam intentar un parto vaginal luego de una cesrea.  Es importante que converse con su mdico desde comienzos del Psychiatrist de modo que pueda [employer], beneficios y opciones. Le dar tiempo para decidir qu es lo mejor en su caso particular. La decisin final de tener un parto vaginal o por cesrea debe tomarse en conjunto, entre usted y el mdico. Cualquier cambio en su salud o la de su beb durante el embarazo puede ser motivo de un cambio de decisin respecto del parto vaginal.  LAS MUJERES QUE OPTAN POR EL  PARTO VAGINAL, DEBEN CONSULTAR AL MDICO PARA ASEGURARSE DE QUE:  La cesrea anterior se haya realizado con un corte (incisin) uterino transversal (no con una incisin vertical clsica).  El canal de parto es lo suficientemente grande como para que pase el [place].  No ha sido sometida a otras operaciones del tero.  Durante el trabajo de parto, le realizarn un monitoreo fetal Forensic scientist, en todo momento.  Habr un quirfano disponible y listo en caso de necesitar una cesrea de emergencia.  Un mdico y personal de quirfano estarn disponibles en todo momento durante el [place] de parto, para realizar una cesrea en caso de ser necesario.  Habr un anestesista disponible en caso de necesitar una cesrea de emergencia.  La nursery est lista y cuenta con personal especializado y el equipo disponible para cuidar al beb en caso de emergencia. BENEFICIOS DEL PARTO VAGINAL:  Permanencia ms breve en el hospital.  Prevencin de los riesgos asociados con el parto por cesrea, por ejemplo:  Complicaciones quirrgicas, como apertura o hernia de la incisin.  Lesiones en otros rganos.  Grant Ruts. Esto puede ocurrir si aparece una infeccin despus de la ciruga. Tambin puede ocurrir como reaccin a los medicamentos administrados para adormecerla durante la [place].  Menos prdida de sangre y menos probabilidad de necesitar una transfusin sangunea.  Menor riesgo de cogulos sanguneos e infeccin.  Tiempo ms corto de recuperacin.  Menor riesgo de remocin del tero (histerectoma).  Menor riesgo de que la placenta cubra parcial o completamente la abertura del tero (placenta previa) en embarazos futuros.  Menos riesgos en el [place] de parto y [place] futuros. RIESGOS  Ruptura del tero. Esto ocurre en menos del 1% de los partos vaginales. El riesgo de que eso suceda es mayor si:  Se toman medidas para iniciar el proceso del [place] de parto (inducir Engineer, manufacturing systems) o Risk manager o  intensificar las contracciones (aumentar el trabajo de [place]).  Se usan medicamentos para ablandar (madurar) el cuello del tero.  Es necesario extraer el tero (histerectoma) si se rompe. No debe llevarse a cabo si:  La cesrea previa se realiz con una incisin vertical (clsica) o con forma de T, o usted no sabe cul de [employer] han practicado.  Ha sufrido ruptura del  teroGaylyn Rong tenido ciertos tipos de ciruga en el tero, como la extirpacin de fibromas uterinos. Pregntele a su mdico sobre otros tipos de cirugas que le impiden tener un parto vaginal.  Tiene ciertos problemas mdicos o relacionados con el parto (obsttricos).  El beb est en problemas.  Tuvo dos cesreas previas y ningn parto vaginal. OTRAS COSAS QUE DEBE SABER:  La anestesia peridural es segura.  Es seguro dar vuelta al beb si se encuentra de nalgas (intentar una versin ceflica externa).  Es seguro intentarlo en caso de mellizos.  El parto vaginal puede no ser apropiado si el beb pesa 8,8lb (4kg) o ms. Sin embargo, las predicciones de Cecilia no son siempre exactas y no deben ser lo nico a tenerse en cuenta para decidir si el parto vaginal es lo indicado para usted.  Hay aumento en el porcentaje de fracasos si el intervalo entre la cesrea y el parto vaginal es de menos de 19 meses.  Su mdico puede aconsejarle no tener un parto vaginal si tiene preeclampsia (hipertensin, protena en la orina e hinchazn en la cara y las extremidades).  El parto vaginal suele ser exitoso si ya tuvo un parto vaginal previamente.  Tambin suele ser exitoso cuando el trabajo de parto comienza espontneamente antes de la fecha.  El parto vaginal despus de Eustace Quail es similar a un parto espontneo vaginal normal. Document Released: 11/11/2007 Document Revised: 03/15/2013 [employer] Patient Information 2015 [place], [place]. This information is not intended to replace advice given to you by your health care provider.  Make sure you discuss any questions you have with your health care provider.

## 2015-01-17 NOTE — Telephone Encounter (Signed)
Contacted patient with interpreter Darletta Moll, information given concerning fetal echo appointment.  Pt verbalizes understanding.

## 2015-01-21 ENCOUNTER — Ambulatory Visit (HOSPITAL_COMMUNITY)
Admission: RE | Admit: 2015-01-21 | Discharge: 2015-01-21 | Disposition: A | Discharge status: Home / Self Care | Payer: Self-pay | Source: Ambulatory Visit | Attending: Obstetrics and Gynecology | Admitting: Obstetrics and Gynecology

## 2015-01-21 DIAGNOSIS — O09892 Supervision of other high risk pregnancies, second trimester: Secondary | ICD-10-CM

## 2015-01-21 DIAGNOSIS — O24912 Unspecified diabetes mellitus in pregnancy, second trimester: Secondary | ICD-10-CM | POA: Insufficient documentation

## 2015-01-28 ENCOUNTER — Ambulatory Visit (INDEPENDENT_AMBULATORY_CARE_PROVIDER_SITE_OTHER): Payer: Self-pay | Admitting: Obstetrics & Gynecology

## 2015-01-28 VITALS — BP 116/64 | HR 81 | Temp 98.5°F | Wt 189.0 lb

## 2015-01-28 DIAGNOSIS — Z23 Encounter for immunization: Secondary | ICD-10-CM

## 2015-01-28 DIAGNOSIS — O24912 Unspecified diabetes mellitus in pregnancy, second trimester: Secondary | ICD-10-CM

## 2015-01-28 LAB — POCT URINALYSIS DIP (DEVICE)
BILIRUBIN URINE: NEGATIVE
Glucose, UA: NEGATIVE mg/dL
HGB URINE DIPSTICK: NEGATIVE
KETONES UR: NEGATIVE mg/dL
Nitrite: NEGATIVE
Protein, ur: NEGATIVE mg/dL
SPECIFIC GRAVITY, URINE: 1.02 (ref 1.005–1.030)
Urobilinogen, UA: 0.2 mg/dL (ref 0.0–1.0)
pH: 6.5 (ref 5.0–8.0)

## 2015-01-28 MED ORDER — GLYBURIDE 2.5 MG PO TABS
ORAL_TABLET | ORAL | Status: DC
Start: 2015-01-28 — End: 2015-02-04

## 2015-01-28 MED ORDER — TETANUS-DIPHTH-ACELL PERTUSSIS 5-2.5-18.5 LF-MCG/0.5 IM SUSP
0.5000 mL | Freq: Once | INTRAMUSCULAR | Status: AC
  Administered 2015-01-28: 0.5 mL via INTRAMUSCULAR

## 2015-01-28 NOTE — Progress Notes (Signed)
Nutrition note: GDM diet f/u Pt has lost 1# @ [redacted]w[redacted]d but has gained wt since last appt. Pt has been checking her BS- fasting: 63-103; 2hr pp: 65-288 (pt reports that one of the 200+ readings was after eating a meal at her sisters house but couldn't remember what she ate). Pt reports eating 2-3 meals/d. 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 Reviewed GDM diet & pt reports no ?s. Per MD: increasing Glyburide dose today. F/u in 2-4 wks Blondell Reveal, MS, RD, LDN, [facility]

## 2015-01-28 NOTE — Progress Notes (Signed)
Spanish interpreter Dian Queen Educated pt on Benefits of breastfeeding for mom 28 wk packet given  Flu/tdap vaccine consented and info given

## 2015-01-28 NOTE — Patient Instructions (Signed)
Diabetes mellitus gestacional (Gestational Diabetes Mellitus) La diabetes mellitus gestacional, ms comnmente conocida como diabetes gestacional es un tipo de diabetes que desarrollan algunas mujeres durante el embarazo. En la diabetes gestacional, el pncreas no produce suficiente insulina (una hormona) o las clulas son menos sensibles a la insulina producida (resistencia a la insulina), o ambas cosas. Normalmente, la insulina mueve los azcares de los alimentos a las clulas de los tejidos. Las clulas de los tejidos utilizan los azcares para obtener energa. La falta de insulina o la falta de una respuesta normal a la insulina hace que el exceso de azcar se acumule en la sangre en lugar de penetrar en las clulas de los tejidos. Como resultado, se producen niveles altos de azcar en la sangre (hiperglucemia). El efecto de los niveles altos de azcar (glucosa) puede causar muchos problemas.  FACTORES DE RIESGO Usted tiene mayor probabilidad de desarrollar diabetes gestacional si tiene antecedentes familiares de diabetes y tambin si tiene uno o ms de los siguientes factores de riesgo:  ndice de masa corporal superior a 30 (obesidad).  Embarazo previo con diabetes gestacional.  La edad avanzada en el momento del embarazo. Si se mantienen los niveles de glucosa en la sangre en un rango normal durante el embarazo, las mujeres pueden tener un embarazo saludable. Si los niveles de glucosa en la sangre no estn bien controlados, puede haber riesgos para usted, el feto o el recin nacido, o durante el trabajo de parto y el parto.  SNTOMAS  Si se presentan sntomas, stos son similares a los sntomas que normalmente experimentar durante el embarazo. Los sntomas de la diabetes gestacional son:   Aumento de la sed (polidipsia).  Aumento de la miccin (poliuria).  Orina con ms frecuencia durante la noche (nocturia).  Prdida de peso. La prdida de peso puede ser muy rpida.  Infecciones  frecuentes y recurrentes.  Cansancio (fatiga).  Debilidad.  Cambios en la visin, como visin borrosa.  Olor a fruta en el aliento.  Dolor abdominal. DIAGNSTICO La diabetes se diagnostica cuando hay aumento de los niveles de glucosa en la sangre. El nivel de glucosa en la sangre puede controlarse en uno o ms de los siguientes anlisis de sangre:  Medicin de glucosa en la sangre en ayunas. No se le permitir comer durante al menos 8 horas antes de que se tome una muestra de sangre.  Pruebas al azar de glucosa en la sangre. El nivel de glucosa en la sangre se controla en cualquier momento del da sin importar el momento en que haya comido.  Prueba de A1c (hemoglobina glucosilada) Una prueba de A1c proporciona informacin sobre el control de la glucosa en la sangre durante los ltimos 3 meses.  Prueba de tolerancia a la glucosa oral (PTGO). La glucosa en la sangre se mide despus de no haber comido (ayunas) durante una a tres horas y despus de beber una bebida que contenga glucosa. Dado que las hormonas que causan la resistencia a la insulina son ms altas alrededor de las semanas 24 a 28 de embarazo, generalmente se realiza una PTGO durante ese tiempo. Si tiene factores de riesgo de diabetes gestacional, su mdico puede hacerle estudios de deteccin antes de las 24semanas de embarazo. TRATAMIENTO   Usted tendr que tomar medicamentos para la diabetes o insulina diariamente para mantener los niveles de glucosa en la sangre en el rango deseado.  Usted tendr que combinar la dosis de insulina con la actividad fsica y la eleccin de alimentos saludables. El objetivo del   tratamiento es mantener el nivel de azcar en la sangre previo a comer (preprandial) y durante la noche entre 60 y 99mg/dl, durante todo el embarazo. El objetivo del tratamiento es mantener el nivel pico de azcar en la sangre despus de comer (glucosa posprandial) entre 100y 140mg/dl. INSTRUCCIONES PARA EL CUIDADO EN EL  HOGAR   Controle su nivel de hemoglobina A1c dos veces al ao.  Contrlese a diario el nivel de glucosa en la sangre segn las indicaciones de su mdico. Es comn realizar controles frecuentes de la glucosa en la sangre.  Supervise las cetonas en la orina cuando est enferma y segn las indicaciones de su mdico.  Tome el medicamento para la diabetes y adminstrese insulina segn las indicaciones de su mdico para mantener el nivel de glucosa en la sangre en el rango deseado.  Nunca se quede sin medicamento para la diabetes o sin insulina. Es necesario que la reciba todos los das.  Ajuste la insulina segn la ingesta de hidratos de carbono. Los hidratos de carbono pueden aumentar los niveles de glucosa en la sangre, pero deben incluirse en su dieta. Los hidratos de carbono aportan vitaminas, minerales y fibra que son una parte esencial de una dieta saludable. Los hidratos de carbono se encuentran en frutas, verduras, cereales integrales, productos lcteos, legumbres y alimentos que contienen azcares aadidos.  Consuma alimentos saludables. Alterne 3 comidas con 3 colaciones.  Aumente de peso saludablemente. El aumento del peso total vara de acuerdo con el ndice de masa corporal que tena antes del embarazo ([facility]).  Lleve una tarjeta de alerta mdica o use una pulsera o medalla de alerta mdica.  Lleve con usted una colacin de 15gramos de hidratos de carbono en todo momento para controlar los niveles bajos de glucosa en la sangre (hipoglucemia). Algunos ejemplos de colaciones de 15gramos de hidratos de carbono son los siguientes:  Tabletas de glucosa, 3 o 4.  Gel de glucosa, tubo de 15 gramos.  Pasas de uva, 2 cucharadas (24 g).  Caramelos de goma, 6.  Galletas de animales, 8.  Jugo de fruta, gaseosa comn, o leche descremada, 4 onzas (120 ml).  Pastillas de goma, 9.  Reconocer la hipoglucemia. Durante el embarazo la hipoglucemia se produce cuando hay niveles de glucosa en la  sangre de 60 mg/dl o menos. El riesgo de hipoglucemia aumenta durante el ayuno o cuando se saltea las comidas, durante o despus de realizar ejercicio intenso y mientras duerme. Los sntomas de hipoglucemia son:  Temblores o sacudidas.  Disminucin de la capacidad de concentracin.  Sudoracin.  Aumento de la frecuencia cardaca.  Dolor de cabeza.  Sequedad en la boca.  Hambre.  Irritabilidad.  Ansiedad.  Sueo agitado.  Alteracin del habla o de la coordinacin.  Confusin.  Tratar la hipoglucemia rpidamente. Si usted est alerta y puede tragar con seguridad, siga la regla de 15/15 que consiste en:  Tome entre 15 y 20gramos de glucosa de accin rpida o carbohidratos. Las opciones de accin rpida son un gel de glucosa, tabletas de glucosa, o 4 onzas (120 ml) de jugo de frutas, gaseosa comn, o leche baja en grasa.  Compruebe su nivel de glucosa en la sangre 15 minutos despus de tomar la glucosa.  Tome entre 15 y 20 gramos ms de glucosa si el nivel de glucosa en la sangre todava es de 70mg/dl o inferior.  Ingiera una comida o una colacin en el lapso de 1 hora una vez que los niveles de glucosa en la sangre vuelven   a la normalidad.  Est atento a la poliuria (miccin excesiva) y la polidipsia (sensacin de mucha sed), que son los primeros signos de la hiperglucemia. El reconocimiento temprano de la hiperglucemia permite un tratamiento oportuno. Trate la hiperglucemia segn le indic su mdico.  Haga actividad fsica por lo menos 30minutos al da o como lo indique su mdico. Se recomienda que 30 minutos despus de cada comida, realice diez minutos de actividad fsica para controlar los niveles de glucosa postprandial en la sangre.  Ajuste su dosis de insulina y la ingesta de alimentos, segn sea necesario, si inicia un nuevo ejercicio o deporte.  Siga su plan para los das de enfermedad cuando no pueda comer o beber como de costumbre.  Evite el tabaco y el  alcohol.  Concurra a todas las visitas de control como se lo haya indicado el mdico.  Siga el consejo del mdico respecto a los controles prenatales y posteriores al parto (postparto), las visitas, la planificacin de las comidas, el ejercicio, los medicamentos, las vitaminas, los anlisis de sangre, otras pruebas mdicas y actividades fsicas.  Realice diariamente el cuidado de la piel y de los pies. Examine su piel y los pies diariamente para ver si tiene cortes, moretones, enrojecimiento, problemas en las uas, sangrado, ampollas o llagas.  Cepllese los dientes y encas por lo menos dos veces al da y use hilo dental al menos una vez por da. Concurra regularmente a las visitas de control con el dentista.  Programe un examen de vista durante el primer trimestre de su embarazo o como lo indique su mdico.  Comparta su plan de control de diabetes en el trabajo o en la escuela.  Mantngase al da con las vacunas.  Aprenda a manejar el estrs.  Obtenga la mayor cantidad posible de informacin sobre la diabetes y solicite ayuda siempre que sea necesario.  Obtenga informacin sobre el amamantamiento y analice esta posibilidad.  Debe controlar el nivel de azcar en la sangre de 6a 12semanas despus del parto. Esto se hace con una prueba de tolerancia a la glucosa oral (PTGO). SOLICITE ATENCIN MDICA SI:   No puede comer alimentos o beber por ms de 6 horas.  Tuvo nuseas o ha vomitado durante ms de 6 horas.  Tiene un nivel de glucosa en la sangre de 200 mg/dl y cetonas en la orina.  Presenta algn cambio en el estado mental.  Desarrolla problemas de visin.  Sufre un dolor persistente de cabeza.  Siente dolor o molestias en la parte superior del abdomen.  Desarrolla una enfermedad grave adicional.  Tuvo diarrea durante ms de 6 horas.  Ha estado enfermo o ha tenido fiebre durante un par de das y no mejora. SOLICITE ATENCIN MDICA DE INMEDIATO SI:   Tiene dificultad  para respirar.  Ya no siente los movimientos del beb.  Est sangrando o tiene flujo vaginal.  Comienza a tener contracciones o trabajo de parto prematuro. ASEGRESE DE QUE:  Comprende estas instrucciones.  Controlar su afeccin.  Recibir ayuda de inmediato si no mejora o si empeora. Document Released: 03/04/2005 Document Revised: 10/09/2013 [employer] Patient Information 2015 [employer]. This information is not intended to replace advice given to you by your health care provider. Make sure you discuss any questions you have with your health care provider.  

## 2015-01-28 NOTE — Progress Notes (Signed)
Subjective:still considering VBAC   Rebecca Holder is a 24 y.o. G2P1001 at [redacted]w[redacted]d being seen today for ongoing prenatal care.  Patient reports no complaints.  Contractions: Not present.  Vag. Bleeding: None. Movement: Present. Denies leaking of fluid.   The following portions of the patient's history were reviewed and updated as appropriate: allergies, current medications, past family history, past medical history, past social history, past surgical history and problem list.   Objective:   Filed Vitals:   01/28/15 1047  BP: 116/64  Pulse: 81  Temp: 98.5 F (36.9 C)  Weight: 189 lb (85.73 kg)    Fetal Status: Fetal Heart Rate (bpm): 149   Movement: Present     General:  Alert, oriented and cooperative. Patient is in no acute distress.  Skin: Skin is warm and dry. No rash noted.   Cardiovascular: Normal heart rate noted  Respiratory: Normal respiratory effort, no problems with respiration noted  Abdomen: Soft, gravid, appropriate for gestational age. Pain/Pressure: Absent     Pelvic: Vag. Bleeding: None     Cervical exam deferred        Extremities: Normal range of motion.  Edema: None  Mental Status: Normal mood and affect. Normal behavior. Normal judgment and thought content.   Urinalysis:      Assessment and Plan:  Pregnancy: G2P1001 at 110w5d  1. Diabetes mellitus affecting pregnancy in second trimester, antepartum FBS 77-102, PP 103-168, will increase am dose to 5 mg glyburide - RPR - HIV antibody (with reflex) - CBC - glyBURIDE (DIABETA) 2.5 MG tablet; Two tablets by mouth at breakfast and one at bedtime  Dispense: 60 tablet; Refill: 3  Preterm labor symptoms and general obstetric precautions including but not limited to vaginal bleeding, contractions, leaking of fluid and fetal movement were reviewed in detail with the patient. Please refer to After Visit Summary for other counseling recommendations.  1 week to review medication changes   Adam Phenix,  MD

## 2015-01-29 LAB — CBC
HEMATOCRIT: 36.7 % (ref 36.0–46.0)
HEMOGLOBIN: 12 g/dL (ref 12.0–15.0)
MCH: 30.5 pg (ref 26.0–34.0)
MCHC: 32.7 g/dL (ref 30.0–36.0)
MCV: 93.4 fL (ref 78.0–100.0)
MPV: 9.9 fL (ref 8.6–12.4)
Platelets: 256 10*3/uL (ref 150–400)
RBC: 3.93 MIL/uL (ref 3.87–5.11)
RDW: 13.6 % (ref 11.5–15.5)
WBC: 9.5 10*3/uL (ref 4.0–10.5)

## 2015-01-29 LAB — RPR

## 2015-01-29 LAB — HIV ANTIBODY (ROUTINE TESTING W REFLEX): HIV 1&2 Ab, 4th Generation: NONREACTIVE

## 2015-02-04 ENCOUNTER — Ambulatory Visit (INDEPENDENT_AMBULATORY_CARE_PROVIDER_SITE_OTHER): Payer: Self-pay | Admitting: Obstetrics & Gynecology

## 2015-02-04 VITALS — BP 113/74 | HR 87 | Temp 98.6°F | Wt 187.0 lb

## 2015-02-04 DIAGNOSIS — O24912 Unspecified diabetes mellitus in pregnancy, second trimester: Secondary | ICD-10-CM

## 2015-02-04 LAB — POCT URINALYSIS DIP (DEVICE)
Glucose, UA: NEGATIVE mg/dL
Hgb urine dipstick: NEGATIVE
KETONES UR: NEGATIVE mg/dL
Nitrite: POSITIVE — AB
PH: 6 (ref 5.0–8.0)
PROTEIN: 30 mg/dL — AB
Urobilinogen, UA: 1 mg/dL (ref 0.0–1.0)

## 2015-02-04 MED ORDER — GLYBURIDE 1.25 MG PO TABS: ORAL_TABLET | ORAL | Status: DC

## 2015-02-04 NOTE — Progress Notes (Signed)
Subjective:  Rebecca Holder is a 24 y.o. G2P1001 at [redacted]w[redacted]d being seen today for ongoing prenatal care.  Patient reports low blood sugar symptoms.  Contractions: Not present.  Vag. Bleeding: None. Movement: Present. Denies leaking of fluid.   The following portions of the patient's history were reviewed and updated as appropriate: allergies, current medications, past family history, past medical history, past social history, past surgical history and problem list.   Objective:   Filed Vitals:   02/04/15 0910  BP: 113/74  Pulse: 87  Temp: 98.6 F (37 C)  Weight: 187 lb (84.823 kg)    Fetal Status: Fetal Heart Rate (bpm): 145   Movement: Present     General:  Alert, oriented and cooperative. Patient is in no acute distress.  Skin: Skin is warm and dry. No rash noted.   Cardiovascular: Normal heart rate noted  Respiratory: Normal respiratory effort, no problems with respiration noted  Abdomen: Soft, gravid, appropriate for gestational age. Pain/Pressure: Absent     Pelvic: Vag. Bleeding: None     Cervical exam deferred        Extremities: Normal range of motion.  Edema: None  Mental Status: Normal mood and affect. Normal behavior. Normal judgment and thought content.   Urinalysis:      Assessment and Plan:  Pregnancy: G2P1001 at [redacted]w[redacted]d  1. Diabetes mellitus affecting pregnancy in second trimester, antepartum Low fasting CBG (37-100, most below 70) Decrease Glyburide to 1.25 mg qhs adn decrease am glyburide to 3.75 mg q am. - glyBURIDE (DIABETA) 1.25 MG tablet; Take three tablets by mouth at breakfast and one at bedtime  Dispense: 120 tablet; Refill: 1  Preterm labor symptoms and general obstetric precautions including but not limited to vaginal bleeding, contractions, leaking of fluid and fetal movement were reviewed in detail with the patient. Please refer to After Visit Summary for other counseling recommendations.   Spent 10 minutes with interpreter reviewing  medication instructions.  Patient "taught back" to me twice correctly. Return in about 10 days (around 02/14/2015).   Lesly Dukes, MD  RN staff to call tomorrow and make sure she is taking meds correctly.

## 2015-02-07 ENCOUNTER — Telehealth: Payer: Self-pay

## 2015-02-07 NOTE — Telephone Encounter (Signed)
Spoke to patient on 02/06/15 regarding her new rx Glyburide 1.25 mg sent to her pharmacy. Explained to patient instructions about her new milagram on med she understood said she was going to pick up rx that evening.

## 2015-02-14 ENCOUNTER — Ambulatory Visit (INDEPENDENT_AMBULATORY_CARE_PROVIDER_SITE_OTHER): Payer: Self-pay | Admitting: Obstetrics & Gynecology

## 2015-02-14 VITALS — BP 108/61 | HR 70 | Temp 98.3°F | Wt 186.5 lb

## 2015-02-14 DIAGNOSIS — O24913 Unspecified diabetes mellitus in pregnancy, third trimester: Secondary | ICD-10-CM

## 2015-02-14 DIAGNOSIS — O24912 Unspecified diabetes mellitus in pregnancy, second trimester: Secondary | ICD-10-CM

## 2015-02-14 DIAGNOSIS — O34219 Maternal care for unspecified type scar from previous cesarean delivery: Secondary | ICD-10-CM

## 2015-02-14 DIAGNOSIS — O3421 Maternal care for scar from previous cesarean delivery: Secondary | ICD-10-CM

## 2015-02-14 DIAGNOSIS — O09893 Supervision of other high risk pregnancies, third trimester: Secondary | ICD-10-CM

## 2015-02-14 LAB — POCT URINALYSIS DIP (DEVICE)
Bilirubin Urine: NEGATIVE
Glucose, UA: NEGATIVE mg/dL
Ketones, ur: NEGATIVE mg/dL
NITRITE: NEGATIVE
PH: 6 (ref 5.0–8.0)
Protein, ur: NEGATIVE mg/dL
Specific Gravity, Urine: 1.025 (ref 1.005–1.030)
UROBILINOGEN UA: 0.2 mg/dL (ref 0.0–1.0)

## 2015-02-14 NOTE — Patient Instructions (Signed)
Return to clinic for any obstetric concerns or go to [facility] for evaluation  

## 2015-02-14 NOTE — Progress Notes (Signed)
Breast feeding tip of the week reviewed via interpreter

## 2015-02-14 NOTE — Progress Notes (Signed)
Subjective:  Rebecca Holder is a 24 y.o. G2P1001 at [redacted]w[redacted]d being seen today for ongoing prenatal care. Patient is Spanish-speaking only, Spanish interpreter present for this encounter.  Patient reports no complaints.  Contractions: Irritability.  Vag. Bleeding: None. Movement: Present. Denies leaking of fluid.   The following portions of the patient's history were reviewed and updated as appropriate: allergies, current medications, past family history, past medical history, past social history, past surgical history and problem list.   Objective:   Filed Vitals:   02/14/15 1105  BP: 108/61  Pulse: 70  Temp: 98.3 F (36.8 C)  Weight: 186 lb 8 oz (84.596 kg)    Fetal Status: Fetal Heart Rate (bpm): 144 Fundal Height: 28 cm Movement: Present     General:  Alert, oriented and cooperative. Patient is in no acute distress.  Skin: Skin is warm and dry. No rash noted.   Cardiovascular: Normal heart rate noted  Respiratory: Normal respiratory effort, no problems with respiration noted  Abdomen: Soft, gravid, appropriate for gestational age. Pain/Pressure: Absent     Pelvic: Vag. Bleeding: None     Cervical exam deferred        Extremities: Normal range of motion.  Edema: None  Mental Status: Normal mood and affect. Normal behavior. Normal judgment and thought content.   Urinalysis: Urine Protein: Negative Urine Glucose: Negative  BS: Mostly within range; a couple of abnormal fasting and PP  Assessment and Plan:  Pregnancy: G2P1001 at [redacted]w[redacted]d 1. Diabetes mellitus affecting pregnancy in second trimester, antepartum Continue Glyburide for now, diet adherence recommended  2. Previous cesarean delivery, antepartum Thinking about TOLAC, risks and benefits reviewed in detail.  Consent givent to her to review at home will make decision by next visit ~32 weeks   3. Supervision of other high risk pregnancy, antepartum, third trimester Preterm labor symptoms and general obstetric  precautions including but not limited to vaginal bleeding, contractions, leaking of fluid and fetal movement were reviewed in detail with the patient. Please refer to After Visit Summary for other counseling recommendations.  Return in about 3 weeks (around 03/07/2015) for OB visit.   Tereso Newcomer, MD

## 2015-03-07 ENCOUNTER — Other Ambulatory Visit: Payer: Self-pay | Admitting: Family Medicine

## 2015-03-07 ENCOUNTER — Ambulatory Visit (HOSPITAL_COMMUNITY)
Admission: RE | Admit: 2015-03-07 | Discharge: 2015-03-07 | Disposition: A | Discharge status: Home / Self Care | Payer: Self-pay | Source: Ambulatory Visit | Attending: Family Medicine | Admitting: Family Medicine

## 2015-03-07 ENCOUNTER — Ambulatory Visit (INDEPENDENT_AMBULATORY_CARE_PROVIDER_SITE_OTHER): Payer: Self-pay | Admitting: Family Medicine

## 2015-03-07 VITALS — BP 109/69 | HR 73 | Temp 98.1°F | Wt 186.5 lb

## 2015-03-07 DIAGNOSIS — O24414 Gestational diabetes mellitus in pregnancy, insulin controlled: Secondary | ICD-10-CM | POA: Insufficient documentation

## 2015-03-07 DIAGNOSIS — O24913 Unspecified diabetes mellitus in pregnancy, third trimester: Secondary | ICD-10-CM

## 2015-03-07 DIAGNOSIS — O34219 Maternal care for unspecified type scar from previous cesarean delivery: Secondary | ICD-10-CM

## 2015-03-07 DIAGNOSIS — O09893 Supervision of other high risk pregnancies, third trimester: Secondary | ICD-10-CM

## 2015-03-07 DIAGNOSIS — O24912 Unspecified diabetes mellitus in pregnancy, second trimester: Secondary | ICD-10-CM

## 2015-03-07 DIAGNOSIS — O3421 Maternal care for scar from previous cesarean delivery: Secondary | ICD-10-CM

## 2015-03-07 LAB — POCT URINALYSIS DIP (DEVICE)
BILIRUBIN URINE: NEGATIVE
GLUCOSE, UA: NEGATIVE mg/dL
Hgb urine dipstick: NEGATIVE
KETONES UR: NEGATIVE mg/dL
Nitrite: NEGATIVE
Protein, ur: NEGATIVE mg/dL
SPECIFIC GRAVITY, URINE: 1.025 (ref 1.005–1.030)
UROBILINOGEN UA: 0.2 mg/dL (ref 0.0–1.0)
pH: 6 (ref 5.0–8.0)

## 2015-03-07 MED ORDER — GLYBURIDE 1.25 MG PO TABS: ORAL_TABLET | ORAL | Status: DC

## 2015-03-07 MED ORDER — PRENATAL 27-0.8 MG PO TABS: 1.0000 | ORAL_TABLET | Freq: Every day | ORAL | Status: AC

## 2015-03-07 NOTE — Progress Notes (Signed)
Breastfeeding tip of the week reviewed Spanish interpreter for encounter: Rebecca Holder Leukocytes: small

## 2015-03-07 NOTE — Progress Notes (Signed)
BPP scheduled for today 03/07/2015 :30pm

## 2015-03-07 NOTE — Addendum Note (Signed)
Addended by: Levie Heritage on: 03/07/2015 11:51 AM   Modules accepted: Orders

## 2015-03-07 NOTE — Progress Notes (Signed)
Subjective:  Rebecca Holder is a 24 y.o. G2P1001 at [redacted]w[redacted]d being seen today for ongoing prenatal care.  Patient reports no complaints.  Contractions: Irritability.  Vag. Bleeding: None. Movement: Present. Denies leaking of fluid.   GDM: Patient taking Glyburide 2.5mg  BID.  Reports no hypoglycemic episodes.  Tolerating medication well Fasting: all < 90 2hr PP:62-218, most in the past week > 120 No hypoglycemic episodes.  The following portions of the patient's history were reviewed and updated as appropriate: allergies, current medications, past family history, past medical history, past social history, past surgical history and problem list.   Objective:   Filed Vitals:   03/07/15 1050  BP: 109/69  Pulse: 73  Temp: 98.1 F (36.7 C)  Weight: 186 lb 8 oz (84.596 kg)    Fetal Status: Fetal Heart Rate (bpm): 161   Movement: Present     General:  Alert, oriented and cooperative. Patient is in no acute distress.  Skin: Skin is warm and dry. No rash noted.   Cardiovascular: Normal heart rate noted  Respiratory: Normal respiratory effort, no problems with respiration noted  Abdomen: Soft, gravid, appropriate for gestational age. Pain/Pressure: Absent     Pelvic: Vag. Bleeding: None     Cervical exam deferred        Extremities: Normal range of motion.  Edema: None  Mental Status: Normal mood and affect. Normal behavior. Normal judgment and thought content.   Urinalysis: Urine Protein: Negative Urine Glucose: Negative  Assessment and Plan:  Pregnancy: G2P1001 at [redacted]w[redacted]d  1. Supervision of other high risk pregnancy, antepartum, third trimester Normal FHT  2. Previous cesarean delivery, antepartum Discussed TOLAC, pt still undecided.  3. Diabetes mellitus affecting pregnancy in second trimester, antepartum Increase glyburide to  in AM and 1.25mg  in PM. Continue CBGs.   Start NST twice weekly  Preterm labor symptoms and general obstetric precautions including but not  limited to vaginal bleeding, contractions, leaking of fluid and fetal movement were reviewed in detail with the patient. Please refer to After Visit Summary for other counseling recommendations.  No Follow-up on file.   Levie Heritage, DO

## 2015-03-07 NOTE — Patient Instructions (Signed)
Tercer trimestre de embarazo (Third Trimester of Pregnancy) El tercer trimestre va desde la semana29 hasta la 42, desde el sptimo hasta el noveno mes, y es la poca en la que el feto crece ms rpidamente. Hacia el final del noveno mes, el feto mide alrededor de 20pulgadas (45cm) de largo y pesa entre 6 y 10 libras (2,700 y 4,500kg).  CAMBIOS EN EL ORGANISMO Su organismo atraviesa por muchos cambios durante el embarazo, y estos varan de una mujer a otra.   Seguir aumentando de peso. Es de esperar que aumente entre 25 y 35libras (11 y 16kg) hacia el final del embarazo.  Podrn aparecer las primeras estras en las caderas, el abdomen y las mamas.  Puede tener necesidad de orinar con ms frecuencia porque el feto baja hacia la pelvis y ejerce presin sobre la vejiga.  Debido al embarazo podr sentir acidez estomacal con frecuencia.  Puede estar estreida, ya que ciertas hormonas enlentecen los movimientos de los msculos que empujan los desechos a travs de los intestinos.  Pueden aparecer hemorroides o abultarse e hincharse las venas (venas varicosas).  Puede sentir dolor plvico debido al aumento de peso y a que las hormonas del embarazo relajan las articulaciones entre los huesos de la pelvis. El dolor de espalda puede ser consecuencia de la sobrecarga de los msculos que soportan la postura.  Tal vez haya cambios en el cabello que pueden incluir su engrosamiento, crecimiento rpido y cambios en la textura. Adems, a algunas mujeres se les cae el cabello durante o despus del embarazo, o tienen el cabello seco o fino. Lo ms probable es que el cabello se le normalice despus del nacimiento del beb.  Las mamas seguirn creciendo y le dolern. A veces, puede haber una secrecin amarilla de las mamas llamada calostro.  El ombligo puede salir hacia afuera.  Puede sentir que le falta el aire debido a que se expande el tero.  Puede notar que el feto "baja" o lo siente ms bajo, en el  abdomen.  Puede tener una prdida de secrecin mucosa con sangre. Esto suele ocurrir en el trmino de unos pocos das a una semana antes de que comience el trabajo de parto.  El cuello del tero se vuelve delgado y blando (se borra) cerca de la fecha de parto. QU DEBE ESPERAR EN LOS EXMENES PRENATALES  Le harn exmenes prenatales cada 2semanas hasta la semana36. A partir de ese momento le harn exmenes semanales. Durante una visita prenatal de rutina:  La pesarn para asegurarse de que usted y el feto estn creciendo normalmente.  Le tomarn la presin arterial.  Le medirn el abdomen para controlar el desarrollo del beb.  Se escucharn los latidos cardacos fetales.  Se evaluarn los resultados de los estudios solicitados en visitas anteriores.  Le revisarn el cuello del tero cuando est prxima la fecha de parto para controlar si este se ha borrado. Alrededor de la semana36, el mdico le revisar el cuello del tero. Al mismo tiempo, realizar un anlisis de las secreciones del tejido vaginal. Este examen es para determinar si hay un tipo de bacteria, estreptococo Grupo B. El mdico le explicar esto con ms detalle. El mdico puede preguntarle lo siguiente:  Cmo le gustara que fuera el parto.  Cmo se siente.  Si siente los movimientos del beb.  Si ha tenido sntomas anormales, como prdida de lquido, sangrado, dolores de cabeza intensos o clicos abdominales.  Si tiene alguna pregunta. Otros exmenes o estudios de deteccin que pueden realizarse   durante el tercer trimestre incluyen lo siguiente:  Anlisis de sangre para controlar las concentraciones de hierro (anemia).  Controles fetales para determinar su salud, nivel de actividad y crecimiento. Si tiene alguna enfermedad o hay problemas durante el embarazo, le harn estudios. FALSO TRABAJO DE PARTO Es posible que sienta contracciones leves e irregulares que finalmente desaparecen. Se llaman contracciones de  Braxton Hicks o falso trabajo de parto. Las contracciones pueden durar horas, das o incluso semanas, antes de que el verdadero trabajo de parto se inicie. Si las contracciones ocurren a intervalos regulares, se intensifican o se hacen dolorosas, lo mejor es que la revise el mdico.  SIGNOS DE TRABAJO DE PARTO   Clicos de tipo menstrual.  Contracciones cada 5minutos o menos.  Contracciones que comienzan en la parte superior del tero y se extienden hacia abajo, a la zona inferior del abdomen y la espalda.  Sensacin de mayor presin en la pelvis o dolor de espalda.  Una secrecin de mucosidad acuosa o con sangre que sale de la vagina. Si tiene alguno de estos signos antes de la semana37 del embarazo, llame a su mdico de inmediato. Debe concurrir al hospital para que la controlen inmediatamente. INSTRUCCIONES PARA EL CUIDADO EN EL HOGAR   Evite fumar, consumir hierbas, beber alcohol y tomar frmacos que no le hayan recetado. Estas sustancias qumicas afectan la formacin y el desarrollo del beb.  Siga las indicaciones del mdico en relacin con el uso de medicamentos. Durante el embarazo, hay medicamentos que son seguros de tomar y otros que no.  Haga actividad fsica solo en la forma indicada por el mdico. Sentir clicos uterinos es un buen signo para detener la actividad fsica.  Contine comiendo alimentos que sanos con regularidad.  Use un sostn que le brinde buen soporte si le duelen las mamas.  No se d baos de inmersin en agua caliente, baos turcos ni saunas.  Colquese el cinturn de seguridad cuando conduzca.  No coma carne cruda ni queso sin cocinar; evite el contacto con las bandejas sanitarias de los gatos y la tierra que estos animales usan. Estos elementos contienen grmenes que pueden causar defectos congnitos en el beb.  Tome las vitaminas prenatales.  Si est estreida, pruebe un laxante suave (si el mdico lo autoriza). Consuma ms alimentos ricos en  fibra, como vegetales y frutas frescos y cereales integrales. Beba gran cantidad de lquido para mantener la orina de tono claro o color amarillo plido.  Dese baos de asiento con agua tibia para aliviar el dolor o las molestias causadas por las hemorroides. Use una crema para las hemorroides si el mdico la autoriza.  Si tiene venas varicosas, use medias de descanso. Eleve los pies durante 15minutos, 3 o 4veces por da. Limite la cantidad de sal en su dieta.  Evite levantar objetos pesados, use zapatos de tacones bajos y mantenga una buena postura.  Descanse con las piernas elevadas si tiene calambres o dolor de cintura.  Visite a su dentista si no lo ha hecho durante el embarazo. Use un cepillo de dientes blando para higienizarse los dientes y psese el hilo dental con suavidad.  Puede seguir manteniendo relaciones sexuales, a menos que el mdico le indique lo contrario.  No haga viajes largos excepto que sea absolutamente necesario y solo con la autorizacin del mdico.  Tome clases prenatales para entender, practicar y hacer preguntas sobre el trabajo de parto y el parto.  Haga un ensayo de la partida al hospital.  Prepare el bolso que   llevar al hospital.  Prepare la habitacin del beb.  Concurra a todas las visitas prenatales segn las indicaciones de su mdico. SOLICITE ATENCIN MDICA SI:  No est segura de que est en trabajo de parto o de que ha roto la bolsa de las aguas.  Tiene mareos.  Siente clicos leves, presin en la pelvis o dolor persistente en el abdomen.  Tiene nuseas, vmitos o diarrea persistentes.  Tiene secrecin vaginal con mal olor.  Siente dolor al orinar. SOLICITE ATENCIN MDICA DE INMEDIATO SI:   Tiene fiebre.  Tiene una prdida de lquido por la vagina.  Tiene sangrado o pequeas prdidas vaginales.  Siente dolor intenso o clicos en el abdomen.  Sube o baja de peso rpidamente.  Tiene dificultad para respirar y siente dolor de  pecho.  Sbitamente se le hinchan mucho el rostro, las manos, los tobillos, los pies o las piernas.  No ha sentido los movimientos del beb durante una hora.  Siente un dolor de cabeza intenso que no se alivia con medicamentos.  Hay cambios en la visin. Document Released: 03/04/2005 Document Revised: 05/30/2013 [employer] Patient Information 2015 [employer]. This information is not intended to replace advice given to you by your health care provider. Make sure you discuss any questions you have with your health care provider.  

## 2015-03-14 ENCOUNTER — Ambulatory Visit (INDEPENDENT_AMBULATORY_CARE_PROVIDER_SITE_OTHER): Payer: Self-pay | Admitting: Obstetrics & Gynecology

## 2015-03-14 VITALS — BP 108/75 | HR 82 | Wt 185.5 lb

## 2015-03-14 DIAGNOSIS — O24912 Unspecified diabetes mellitus in pregnancy, second trimester: Secondary | ICD-10-CM

## 2015-03-14 DIAGNOSIS — O24913 Unspecified diabetes mellitus in pregnancy, third trimester: Secondary | ICD-10-CM

## 2015-03-14 DIAGNOSIS — O24313 Unspecified pre-existing diabetes mellitus in pregnancy, third trimester: Secondary | ICD-10-CM

## 2015-03-14 LAB — POCT URINALYSIS DIP (DEVICE)
Bilirubin Urine: NEGATIVE
GLUCOSE, UA: NEGATIVE mg/dL
Ketones, ur: NEGATIVE mg/dL
NITRITE: POSITIVE — AB
PROTEIN: NEGATIVE mg/dL
Specific Gravity, Urine: 1.025 (ref 1.005–1.030)
UROBILINOGEN UA: 0.2 mg/dL (ref 0.0–1.0)
pH: 6 (ref 5.0–8.0)

## 2015-03-14 NOTE — Progress Notes (Signed)
Blanca used for interpreter Positive nitrites & mod leuks on UA

## 2015-03-14 NOTE — Progress Notes (Signed)
Subjective:  Rebecca Holder is a 24 y.o. G2P1001 at [redacted]w[redacted]d being seen today for ongoing prenatal care.  Patient reports no complaints.  Contractions: Not present.  Vag. Bleeding: None. Movement: Present. Denies leaking of fluid.   The following portions of the patient's history were reviewed and updated as appropriate: allergies, current medications, past family history, past medical history, past social history, past surgical history and problem list.   Objective:   Filed Vitals:   03/14/15 1035  BP: 108/75  Pulse: 82    Fetal Status: Fetal Heart Rate (bpm): nst   Movement: Present     General:  Alert, oriented and cooperative. Patient is in no acute distress.  Skin: Skin is warm and dry. No rash noted.   Cardiovascular: Normal heart rate noted  Respiratory: Normal respiratory effort, no problems with respiration noted  Abdomen: Soft, gravid, appropriate for gestational age. Pain/Pressure: Absent     Pelvic: Vag. Bleeding: None     Cervical exam deferred        Extremities: Normal range of motion.  Edema: None  Mental Status: Normal mood and affect. Normal behavior. Normal judgment and thought content.   Urinalysis: Urine Protein: Negative Urine Glucose: Negative  Assessment and Plan:  Pregnancy: G2P1001 at [redacted]w[redacted]d  1. Diabetes mellitus affecting pregnancy in third trimester -Cont 2x week testing - Fetal nonstress test -61-93 fasting 2 hr breakfast 62-218  About 1/2 nml and 1/2 abnml 2 hr lunch 77-209--75% abnml 2 hr dinner 66-172--about 25% abnml Pt has only been taking 3.75 mg q am (ordred to take 5 mg).  Pt instructed to take 5 mg every morning and continue 1.25 mg qhs  2. Diabetes mellitus affecting pregnancy in second trimester, antepartum As above  Preterm labor symptoms and general obstetric precautions including but not limited to vaginal bleeding, contractions, leaking of fluid and fetal movement were reviewed in detail with the patient. Please refer to  After Visit Summary for other counseling recommendations.  Return for nst.   Lesly Dukes, MD

## 2015-03-21 ENCOUNTER — Ambulatory Visit (INDEPENDENT_AMBULATORY_CARE_PROVIDER_SITE_OTHER): Payer: Medicaid Other | Admitting: Obstetrics & Gynecology

## 2015-03-21 VITALS — BP 118/61 | HR 90 | Wt 188.0 lb

## 2015-03-21 DIAGNOSIS — O24913 Unspecified diabetes mellitus in pregnancy, third trimester: Secondary | ICD-10-CM | POA: Diagnosis not present

## 2015-03-21 DIAGNOSIS — O09893 Supervision of other high risk pregnancies, third trimester: Secondary | ICD-10-CM

## 2015-03-21 DIAGNOSIS — O24912 Unspecified diabetes mellitus in pregnancy, second trimester: Secondary | ICD-10-CM

## 2015-03-21 LAB — POCT URINALYSIS DIP (DEVICE)
BILIRUBIN URINE: NEGATIVE
Glucose, UA: NEGATIVE mg/dL
HGB URINE DIPSTICK: NEGATIVE
KETONES UR: NEGATIVE mg/dL
Nitrite: NEGATIVE
PH: 6 (ref 5.0–8.0)
Protein, ur: NEGATIVE mg/dL
SPECIFIC GRAVITY, URINE: 1.015 (ref 1.005–1.030)
Urobilinogen, UA: 0.2 mg/dL (ref 0.0–1.0)

## 2015-03-21 MED ORDER — GLYBURIDE 5 MG PO TABS: ORAL_TABLET | ORAL | Status: DC

## 2015-03-21 NOTE — Progress Notes (Signed)
NST reactive  Subjective:  Rebecca Holder is a 24 y.o. G2P1001 at 1182w1d being seen today for ongoing prenatal care.  Patient reports no complaints.  Contractions: Irritability.  Vag. Bleeding: None. Movement: Present. Denies leaking of fluid.   The following portions of the patient's history were reviewed and updated as appropriate: allergies, current medications, past family history, past medical history, past social history, past surgical history and problem list. Problem list updated.  Objective:   Filed Vitals:   03/21/15 1044  BP: 118/61  Pulse: 90  Weight: 188 lb (85.276 kg)    Fetal Status: Fetal Heart Rate (bpm): NST   Movement: Present     General:  Alert, oriented and cooperative. Patient is in no acute distress.  Skin: Skin is warm and dry. No rash noted.   Cardiovascular: Normal heart rate noted  Respiratory: Normal respiratory effort, no problems with respiration noted  Abdomen: Soft, gravid, appropriate for gestational age. Pain/Pressure: Present     Pelvic: Vag. Bleeding: None     Cervical exam deferred        Extremities: Normal range of motion.  Edema: None  Mental Status: Normal mood and affect. Normal behavior. Normal judgment and thought content.   Urinalysis: Urine Protein: Negative Urine Glucose: Negative  Assessment and Plan:  Pregnancy: G2P1001 at 10682w1d  1. Diabetes mellitus in pregnancy in third trimester  - Amniotic fluid index with NST  2. Diabetes mellitus affecting pregnancy in second trimester, antepartum PP BG up to 272, 200, most <140, FBS <94 - glyBURIDE (DIABETA) 5 MG tablet; Take one tablet by mouth at breakfast and one at bedtime  Dispense: 60 tablet; Refill: 2  3. Supervision of other high risk pregnancy, antepartum, third trimester Reactive NST Desires TOLAC, signed consetn Preterm labor symptoms and general obstetric precautions including but not limited to vaginal bleeding, contractions, leaking of fluid and fetal movement  were reviewed in detail with the patient. Please refer to After Visit Summary for other counseling recommendations.  Return in about 5 days (around 03/26/2015) for 2x/wk as scheduled.   Adam PhenixJames G Arnold, MD

## 2015-03-21 NOTE — Progress Notes (Signed)
Interpreter [employer] present for encounter.  Breastfeeding tip of the week reviewed.

## 2015-03-21 NOTE — Patient Instructions (Signed)
Tercer trimestre de embarazo (Third Trimester of Pregnancy) El tercer trimestre comprende desde la semana29 hasta la semana42, es decir, desde el mes7 hasta el mes9. El tercer trimestre es un perodo en el que el feto crece rpidamente. Hacia el final del noveno mes, el feto mide alrededor de 20pulgadas (45cm) de largo y pesa entre 6 y 10 libras (2,700 y 4,500kg).  CAMBIOS EN EL ORGANISMO Su organismo atraviesa por muchos cambios durante el embarazo, y estos varan de una mujer a otra.   Seguir aumentando de peso. Es de esperar que aumente entre 25 y 35libras (11 y 16kg) hacia el final del embarazo.  Podrn aparecer las primeras estras en las caderas, el abdomen y las mamas.  Puede tener necesidad de orinar con ms frecuencia porque el feto baja hacia la pelvis y ejerce presin sobre la vejiga.  Debido al embarazo podr sentir acidez estomacal con frecuencia.  Puede estar estreida, ya que ciertas hormonas enlentecen los movimientos de los msculos que empujan los desechos a travs de los intestinos.  Pueden aparecer hemorroides o abultarse e hincharse las venas (venas varicosas).  Puede sentir dolor plvico debido al aumento de peso y a que las hormonas del embarazo relajan las articulaciones entre los huesos de la pelvis. El dolor de espalda puede ser consecuencia de la sobrecarga de los msculos que soportan la postura.  Tal vez haya cambios en el cabello que pueden incluir su engrosamiento, crecimiento rpido y cambios en la textura. Adems, a algunas mujeres se les cae el cabello durante o despus del embarazo, o tienen el cabello seco o fino. Lo ms probable es que el cabello se le normalice despus del nacimiento del beb.  Las mamas seguirn creciendo y le dolern. A veces, puede haber una secrecin amarilla de las mamas llamada calostro.  El ombligo puede salir hacia afuera.  Puede sentir que le falta el aire debido a que se expande el tero.  Puede notar que el feto  "baja" o lo siente ms bajo, en el abdomen.  Puede tener una prdida de secrecin mucosa con sangre. Esto suele ocurrir en el trmino de unos pocos das a una semana antes de que comience el trabajo de parto.  El cuello del tero se vuelve delgado y blando (se borra) cerca de la fecha de parto. QU DEBE ESPERAR EN LOS EXMENES PRENATALES  Le harn exmenes prenatales cada 2semanas hasta la semana36. A partir de ese momento le harn exmenes semanales. Durante una visita prenatal de rutina:  La pesarn para asegurarse de que usted y el feto estn creciendo normalmente.  Le tomarn la presin arterial.  Le medirn el abdomen para controlar el desarrollo del beb.  Se escucharn los latidos cardacos fetales.  Se evaluarn los resultados de los estudios solicitados en visitas anteriores.  Le revisarn el cuello del tero cuando est prxima la fecha de parto para controlar si este se ha borrado. Alrededor de la semana36, el mdico le revisar el cuello del tero. Al mismo tiempo, realizar un anlisis de las secreciones del tejido vaginal. Este examen es para determinar si hay un tipo de bacteria, estreptococo Grupo B. El mdico le explicar esto con ms detalle. El mdico puede preguntarle lo siguiente:  Cmo le gustara que fuera el parto.  Cmo se siente.  Si siente los movimientos del beb.  Si ha tenido sntomas anormales, como prdida de lquido, sangrado, dolores de cabeza intensos o clicos abdominales.  Si est consumiendo algn producto que contenga tabaco, como cigarrillos, tabaco   de mascar y cigarrillos electrnicos.  Si tiene alguna pregunta. Otros exmenes o estudios de deteccin que pueden realizarse durante el tercer trimestre incluyen lo siguiente:  Anlisis de sangre para controlar los niveles de hierro (anemia).  Controles fetales para determinar su salud, nivel de actividad y crecimiento. Si tiene alguna enfermedad o hay problemas durante el embarazo, le harn  estudios.  Prueba del VIH (virus de inmunodeficiencia humana). Si corre un riesgo alto, pueden realizarle una prueba de deteccin del VIH durante el tercer trimestre del embarazo. FALSO TRABAJO DE PARTO Es posible que sienta contracciones leves e irregulares que finalmente desaparecen. Se llaman contracciones de Braxton Hicks o falso trabajo de parto. Las contracciones pueden durar horas, das o incluso semanas, antes de que el verdadero trabajo de parto se inicie. Si las contracciones ocurren a intervalos regulares, se intensifican o se hacen dolorosas, lo mejor es que la revise el mdico.  SIGNOS DE TRABAJO DE PARTO   Clicos de tipo menstrual.  Contracciones cada 5minutos o menos.  Contracciones que comienzan en la parte superior del tero y se extienden hacia abajo, a la zona inferior del abdomen y la espalda.  Sensacin de mayor presin en la pelvis o dolor de espalda.  Una secrecin de mucosidad acuosa o con sangre que sale de la vagina. Si tiene alguno de estos signos antes de la semana37 del embarazo, llame a su mdico de inmediato. Debe concurrir al hospital para que la controlen inmediatamente. INSTRUCCIONES PARA EL CUIDADO EN EL HOGAR   Evite fumar, consumir hierbas, beber alcohol y tomar frmacos que no le hayan recetado. Estas sustancias qumicas afectan la formacin y el desarrollo del beb.  No consuma ningn producto que contenga tabaco, lo que incluye cigarrillos, tabaco de mascar y cigarrillos electrnicos. Si necesita ayuda para dejar de fumar, consulte al mdico. Puede recibir asesoramiento y otro tipo de recursos para dejar de fumar.  Siga las indicaciones del mdico en relacin con el uso de medicamentos. Durante el embarazo, hay medicamentos que son seguros de tomar y otros que no.  Haga ejercicio solamente como se lo haya indicado el mdico. Sentir clicos uterinos es un buen signo para detener la actividad fsica.  Contine comiendo alimentos sanos con  regularidad.  Use un sostn que le brinde buen soporte si le duelen las mamas.  No se d baos de inmersin en agua caliente, baos turcos ni saunas.  Use el cinturn de seguridad en todo momento mientras conduce.  No coma carne cruda ni queso sin cocinar; evite el contacto con las bandejas sanitarias de los gatos y la tierra que estos animales usan. Estos elementos contienen grmenes que pueden causar defectos congnitos en el beb.  Tome las vitaminas prenatales.  Tome entre 1500 y 2000mg de calcio diariamente comenzando en la semana20 del embarazo hasta el parto.  Si est estreida, pruebe un laxante suave (si el mdico lo autoriza). Consuma ms alimentos ricos en fibra, como vegetales y frutas frescos y cereales integrales. Beba gran cantidad de lquido para mantener la orina de tono claro o color amarillo plido.  Dese baos de asiento con agua tibia para aliviar el dolor o las molestias causadas por las hemorroides. Use una crema para las hemorroides si el mdico la autoriza.  Si tiene venas varicosas, use medias de descanso. Eleve los pies durante 15minutos, 3 o 4veces por da. Limite el consumo de sal en su dieta.  Evite levantar objetos pesados, use zapatos de tacones bajos y mantenga una buena postura.  Descanse   con las piernas elevadas si tiene calambres o dolor de cintura.  Visite a su dentista si no lo ha hecho durante el embarazo. Use un cepillo de dientes blando para higienizarse los dientes y psese el hilo dental con suavidad.  Puede seguir manteniendo relaciones sexuales, a menos que el mdico le indique lo contrario.  No haga viajes largos excepto que sea absolutamente necesario y solo con la autorizacin del mdico.  Tome clases prenatales para entender, practicar y hacer preguntas sobre el trabajo de parto y el parto.  Haga un ensayo de la partida al hospital.  Prepare el bolso que llevar al hospital.  Prepare la habitacin del beb.  Concurra a todas  las visitas prenatales segn las indicaciones de su mdico. SOLICITE ATENCIN MDICA SI:  No est segura de que est en trabajo de parto o de que ha roto la bolsa de las aguas.  Tiene mareos.  Siente clicos leves, presin en la pelvis o dolor persistente en el abdomen.  Tiene nuseas, vmitos o diarrea persistentes.  Observa una secrecin vaginal con mal olor.  Siente dolor al orinar. SOLICITE ATENCIN MDICA DE INMEDIATO SI:   Tiene fiebre.  Tiene una prdida de lquido por la vagina.  Tiene sangrado o pequeas prdidas vaginales.  Siente dolor intenso o clicos en el abdomen.  Sube o baja de peso rpidamente.  Tiene dificultad para respirar y siente dolor de pecho.  Sbitamente se le hinchan mucho el rostro, las manos, los tobillos, los pies o las piernas.  No ha sentido los movimientos del beb durante una hora.  Siente un dolor de cabeza intenso que no se alivia con medicamentos.  Su visin se modifica.   Esta informacin no tiene como fin reemplazar el consejo del mdico. Asegrese de hacerle al mdico cualquier pregunta que tenga.   Document Released: 03/04/2005 Document Revised: 06/15/2014 Elsevier Interactive Patient Education 2016 [employer]  

## 2015-03-22 ENCOUNTER — Encounter: Payer: Self-pay | Admitting: *Deleted

## 2015-03-26 ENCOUNTER — Ambulatory Visit (INDEPENDENT_AMBULATORY_CARE_PROVIDER_SITE_OTHER): Payer: Medicaid Other | Admitting: *Deleted

## 2015-03-26 VITALS — BP 104/65 | HR 83

## 2015-03-26 DIAGNOSIS — O24913 Unspecified diabetes mellitus in pregnancy, third trimester: Secondary | ICD-10-CM | POA: Diagnosis not present

## 2015-03-26 NOTE — Progress Notes (Signed)
Interpreter Amanda PeaAlvi Almonte present for encounter.  US for growth scheduled 10/20

## 2015-03-26 NOTE — Progress Notes (Signed)
10/18 NST reviewed and reactive 

## 2015-03-28 ENCOUNTER — Ambulatory Visit (INDEPENDENT_AMBULATORY_CARE_PROVIDER_SITE_OTHER): Payer: Medicaid Other | Admitting: Obstetrics and Gynecology

## 2015-03-28 ENCOUNTER — Encounter: Payer: Self-pay | Admitting: Obstetrics and Gynecology

## 2015-03-28 ENCOUNTER — Ambulatory Visit (HOSPITAL_COMMUNITY)
Admission: RE | Admit: 2015-03-28 | Discharge: 2015-03-28 | Disposition: A | Discharge status: Home / Self Care | Payer: Self-pay | Source: Ambulatory Visit | Attending: Obstetrics & Gynecology | Admitting: Obstetrics & Gynecology

## 2015-03-28 ENCOUNTER — Other Ambulatory Visit: Payer: Self-pay | Admitting: Obstetrics & Gynecology

## 2015-03-28 VITALS — BP 119/63 | HR 84 | Wt 188.6 lb

## 2015-03-28 DIAGNOSIS — O34219 Maternal care for unspecified type scar from previous cesarean delivery: Secondary | ICD-10-CM

## 2015-03-28 DIAGNOSIS — IMO0001 Reserved for inherently not codable concepts without codable children: Secondary | ICD-10-CM

## 2015-03-28 DIAGNOSIS — O09893 Supervision of other high risk pregnancies, third trimester: Secondary | ICD-10-CM

## 2015-03-28 DIAGNOSIS — O24912 Unspecified diabetes mellitus in pregnancy, second trimester: Secondary | ICD-10-CM

## 2015-03-28 DIAGNOSIS — Z3A35 35 weeks gestation of pregnancy: Secondary | ICD-10-CM | POA: Insufficient documentation

## 2015-03-28 DIAGNOSIS — O24913 Unspecified diabetes mellitus in pregnancy, third trimester: Secondary | ICD-10-CM

## 2015-03-28 DIAGNOSIS — R4789 Other speech disturbances: Secondary | ICD-10-CM

## 2015-03-28 LAB — POCT URINALYSIS DIP (DEVICE)
BILIRUBIN URINE: NEGATIVE
GLUCOSE, UA: 250 mg/dL — AB
KETONES UR: NEGATIVE mg/dL
Nitrite: POSITIVE — AB
Protein, ur: 30 mg/dL — AB
Urobilinogen, UA: 0.2 mg/dL (ref 0.0–1.0)
pH: 5.5 (ref 5.0–8.0)

## 2015-03-28 NOTE — Progress Notes (Signed)
Subjective:  Rebecca Holder is a 24 y.o. G2P1001 at 4261w1d being seen today for ongoing prenatal care.  Patient reports no complaints.  Contractions: Irregular.  Vag. Bleeding: None. Movement: Present. Denies leaking of fluid.   The following portions of the patient's history were reviewed and updated as appropriate: allergies, current medications, past family history, past medical history, past social history, past surgical history and problem list. Problem list updated.  Objective:   Filed Vitals:   03/28/15 1336  BP: 119/63  Pulse: 84  Weight: 188 lb 9.6 oz (85.548 kg)    Fetal Status: Fetal Heart Rate (bpm): NST   Movement: Present     General:  Alert, oriented and cooperative. Patient is in no acute distress.  Skin: Skin is warm and dry. No rash noted.   Cardiovascular: Normal heart rate noted  Respiratory: Normal respiratory effort, no problems with respiration noted  Abdomen: Soft, gravid, appropriate for gestational age. Pain/Pressure: Present     Pelvic: Vag. Bleeding: None     Cervical exam deferred        Extremities: Normal range of motion.  Edema: None  Mental Status: Normal mood and affect. Normal behavior. Normal judgment and thought content.   Urinalysis: Urine Protein: 1+ Urine Glucose: 1+  Assessment and Plan:  Pregnancy: G2P1001 at 6161w1d  1. Diabetes mellitus affecting pregnancy in second trimester, antepartum CBGs reviewed- Most fasting very low in the 30's. PP vary as high as 140. Patient is not adhering to the diet. Importance of euglycemia reviewed Advised to decrease glyburide at bedtime to 2.5 mg and 5 mg in the morning - Fetal nonstress test- NST reviewed and reactive 12/20 ultrasound not available for review  2. Supervision of other high risk pregnancy, antepartum, third trimester   3. Previous cesarean delivery, antepartum Desires TOLAC  4. Spanish speaking patient Interpreter present during encounter  Term labor symptoms and general  obstetric precautions including but not limited to vaginal bleeding, contractions, leaking of fluid and fetal movement were reviewed in detail with the patient. Please refer to After Visit Summary for other counseling recommendations.  Return in about 4 days (around 04/01/2015).   Catalina AntiguaPeggy Mariselda Badalamenti, MD

## 2015-03-28 NOTE — Progress Notes (Signed)
Used interpreter Rebecca Holder. Urinalysis shows + nitrites. Patient denies pain with urination. Will send urine for culture. Informed patient will call her with results and which medicine to take or she can find out at her next nst appointment.

## 2015-04-01 ENCOUNTER — Ambulatory Visit (INDEPENDENT_AMBULATORY_CARE_PROVIDER_SITE_OTHER): Payer: Medicaid Other | Admitting: *Deleted

## 2015-04-01 VITALS — BP 101/66 | HR 100

## 2015-04-01 DIAGNOSIS — O24913 Unspecified diabetes mellitus in pregnancy, third trimester: Secondary | ICD-10-CM | POA: Diagnosis not present

## 2015-04-01 NOTE — Progress Notes (Signed)
Interpreter Raquel Mora present for encounter.  

## 2015-04-01 NOTE — Progress Notes (Signed)
Reactive NST 

## 2015-04-04 ENCOUNTER — Other Ambulatory Visit: Payer: Self-pay | Admitting: General Practice

## 2015-04-04 ENCOUNTER — Ambulatory Visit (INDEPENDENT_AMBULATORY_CARE_PROVIDER_SITE_OTHER): Payer: Medicaid Other | Admitting: Obstetrics & Gynecology

## 2015-04-04 ENCOUNTER — Ambulatory Visit (HOSPITAL_COMMUNITY)
Admission: RE | Admit: 2015-04-04 | Discharge: 2015-04-04 | Disposition: A | Discharge status: Home / Self Care | Payer: Self-pay | Source: Ambulatory Visit | Attending: Obstetrics & Gynecology | Admitting: Obstetrics & Gynecology

## 2015-04-04 VITALS — BP 122/79 | HR 80 | Wt 188.0 lb

## 2015-04-04 DIAGNOSIS — O24414 Gestational diabetes mellitus in pregnancy, insulin controlled: Secondary | ICD-10-CM | POA: Insufficient documentation

## 2015-04-04 DIAGNOSIS — O24913 Unspecified diabetes mellitus in pregnancy, third trimester: Secondary | ICD-10-CM | POA: Diagnosis present

## 2015-04-04 DIAGNOSIS — O09893 Supervision of other high risk pregnancies, third trimester: Secondary | ICD-10-CM

## 2015-04-04 DIAGNOSIS — O24912 Unspecified diabetes mellitus in pregnancy, second trimester: Secondary | ICD-10-CM

## 2015-04-04 DIAGNOSIS — O34219 Maternal care for unspecified type scar from previous cesarean delivery: Secondary | ICD-10-CM

## 2015-04-04 DIAGNOSIS — Z113 Encounter for screening for infections with a predominantly sexual mode of transmission: Secondary | ICD-10-CM | POA: Diagnosis not present

## 2015-04-04 DIAGNOSIS — Z3A36 36 weeks gestation of pregnancy: Secondary | ICD-10-CM

## 2015-04-04 LAB — POCT URINALYSIS DIP (DEVICE)
BILIRUBIN URINE: NEGATIVE
GLUCOSE, UA: NEGATIVE mg/dL
Hgb urine dipstick: NEGATIVE
Ketones, ur: NEGATIVE mg/dL
NITRITE: NEGATIVE
PH: 6 (ref 5.0–8.0)
Protein, ur: NEGATIVE mg/dL
Specific Gravity, Urine: 1.02 (ref 1.005–1.030)
Urobilinogen, UA: 0.2 mg/dL (ref 0.0–1.0)

## 2015-04-04 LAB — OB RESULTS CONSOLE GBS: GBS: NEGATIVE

## 2015-04-04 MED ORDER — GLYBURIDE 2.5 MG PO TABS
ORAL_TABLET | ORAL | Status: DC
Start: 2015-04-04 — End: 2015-04-15

## 2015-04-04 NOTE — Progress Notes (Signed)
Subjective:  Rebecca Holder is a 24 y.o. G2P1001 at 574w1d being seen today for ongoing prenatal care.  Patient reports no complaints.  Contractions: Not present.  Vag. Bleeding: None. Movement: Present. Denies leaking of fluid.   The following portions of the patient's history were reviewed and updated as appropriate: allergies, current medications, past family history, past medical history, past social history, past surgical history and problem list. Problem list updated.  Objective:   Filed Vitals:   04/04/15 0852  BP: 122/79  Pulse: 80  Weight: 188 lb (85.276 kg)    Fetal Status:     Movement: Present     General:  Alert, oriented and cooperative. Patient is in no acute distress.  Skin: Skin is warm and dry. No rash noted.   Cardiovascular: Normal heart rate noted  Respiratory: Normal respiratory effort, no problems with respiration noted  Abdomen: Soft, gravid, appropriate for gestational age. Pain/Pressure: Absent     Pelvic: Vag. Bleeding: None      Extremities: Normal range of motion.  Edema: None  Mental Status: Normal mood and affect. Normal behavior. Normal judgment and thought content.   Urinalysis:      Assessment and Plan:  Pregnancy: G2P1001 at 314w1d  1. Supervision of other high risk pregnancy, antepartum, third trimester Low BS in AM bur better cont with 2.5 mg glyburide HS and 5 mg AM - Fetal nonstress test - Culture, beta strep (group b only) - GC[facility]Chlamydia probe amp ([facility] - US MFM OB LIMITED; Future  2. Diabetes mellitus affecting pregnancy in second trimester, antepartum See above NST reactive today - glyBURIDE (DIABETA) 2.5 MG tablet; Take two tablets by mouth at breakfast and one at bedtime  Dispense: 60 tablet; Refill: 1  Preterm labor symptoms and general obstetric precautions including but not limited to vaginal bleeding, contractions, leaking of fluid and fetal movement were reviewed in detail with the patient. Please  refer to After Visit Summary for other counseling recommendations.  Return in about 1 week (around 04/11/2015) for OB f/u & NST/AFI.   Adam PhenixJames G Karalynn Cottone, MD

## 2015-04-04 NOTE — Progress Notes (Signed)
Raquel used for interpreter 

## 2015-04-04 NOTE — Patient Instructions (Signed)
Tercer trimestre de embarazo (Third Trimester of Pregnancy) El tercer trimestre comprende desde la semana29 hasta la semana42, es decir, desde el mes7 hasta el mes9. El tercer trimestre es un perodo en el que el feto crece rpidamente. Hacia el final del noveno mes, el feto mide alrededor de 20pulgadas (45cm) de largo y pesa entre 6 y 10 libras (2,700 y 4,500kg).  CAMBIOS EN EL ORGANISMO Su organismo atraviesa por muchos cambios durante el embarazo, y estos varan de una mujer a otra.   Seguir aumentando de peso. Es de esperar que aumente entre 25 y 35libras (11 y 16kg) hacia el final del embarazo.  Podrn aparecer las primeras estras en las caderas, el abdomen y las mamas.  Puede tener necesidad de orinar con ms frecuencia porque el feto baja hacia la pelvis y ejerce presin sobre la vejiga.  Debido al embarazo podr sentir acidez estomacal con frecuencia.  Puede estar estreida, ya que ciertas hormonas enlentecen los movimientos de los msculos que empujan los desechos a travs de los intestinos.  Pueden aparecer hemorroides o abultarse e hincharse las venas (venas varicosas).  Puede sentir dolor plvico debido al aumento de peso y a que las hormonas del embarazo relajan las articulaciones entre los huesos de la pelvis. El dolor de espalda puede ser consecuencia de la sobrecarga de los msculos que soportan la postura.  Tal vez haya cambios en el cabello que pueden incluir su engrosamiento, crecimiento rpido y cambios en la textura. Adems, a algunas mujeres se les cae el cabello durante o despus del embarazo, o tienen el cabello seco o fino. Lo ms probable es que el cabello se le normalice despus del nacimiento del beb.  Las mamas seguirn creciendo y le dolern. A veces, puede haber una secrecin amarilla de las mamas llamada calostro.  El ombligo puede salir hacia afuera.  Puede sentir que le falta el aire debido a que se expande el tero.  Puede notar que el feto  "baja" o lo siente ms bajo, en el abdomen.  Puede tener una prdida de secrecin mucosa con sangre. Esto suele ocurrir en el trmino de unos pocos das a una semana antes de que comience el trabajo de parto.  El cuello del tero se vuelve delgado y blando (se borra) cerca de la fecha de parto. QU DEBE ESPERAR EN LOS EXMENES PRENATALES  Le harn exmenes prenatales cada 2semanas hasta la semana36. A partir de ese momento le harn exmenes semanales. Durante una visita prenatal de rutina:  La pesarn para asegurarse de que usted y el feto estn creciendo normalmente.  Le tomarn la presin arterial.  Le medirn el abdomen para controlar el desarrollo del beb.  Se escucharn los latidos cardacos fetales.  Se evaluarn los resultados de los estudios solicitados en visitas anteriores.  Le revisarn el cuello del tero cuando est prxima la fecha de parto para controlar si este se ha borrado. Alrededor de la semana36, el mdico le revisar el cuello del tero. Al mismo tiempo, realizar un anlisis de las secreciones del tejido vaginal. Este examen es para determinar si hay un tipo de bacteria, estreptococo Grupo B. El mdico le explicar esto con ms detalle. El mdico puede preguntarle lo siguiente:  Cmo le gustara que fuera el parto.  Cmo se siente.  Si siente los movimientos del beb.  Si ha tenido sntomas anormales, como prdida de lquido, sangrado, dolores de cabeza intensos o clicos abdominales.  Si est consumiendo algn producto que contenga tabaco, como cigarrillos, tabaco   de mascar y cigarrillos electrnicos.  Si tiene alguna pregunta. Otros exmenes o estudios de deteccin que pueden realizarse durante el tercer trimestre incluyen lo siguiente:  Anlisis de sangre para controlar los niveles de hierro (anemia).  Controles fetales para determinar su salud, nivel de actividad y crecimiento. Si tiene alguna enfermedad o hay problemas durante el embarazo, le harn  estudios.  Prueba del VIH (virus de inmunodeficiencia humana). Si corre un riesgo alto, pueden realizarle una prueba de deteccin del VIH durante el tercer trimestre del embarazo. FALSO TRABAJO DE PARTO Es posible que sienta contracciones leves e irregulares que finalmente desaparecen. Se llaman contracciones de Braxton Hicks o falso trabajo de parto. Las contracciones pueden durar horas, das o incluso semanas, antes de que el verdadero trabajo de parto se inicie. Si las contracciones ocurren a intervalos regulares, se intensifican o se hacen dolorosas, lo mejor es que la revise el mdico.  SIGNOS DE TRABAJO DE PARTO   Clicos de tipo menstrual.  Contracciones cada 5minutos o menos.  Contracciones que comienzan en la parte superior del tero y se extienden hacia abajo, a la zona inferior del abdomen y la espalda.  Sensacin de mayor presin en la pelvis o dolor de espalda.  Una secrecin de mucosidad acuosa o con sangre que sale de la vagina. Si tiene alguno de estos signos antes de la semana37 del embarazo, llame a su mdico de inmediato. Debe concurrir al hospital para que la controlen inmediatamente. INSTRUCCIONES PARA EL CUIDADO EN EL HOGAR   Evite fumar, consumir hierbas, beber alcohol y tomar frmacos que no le hayan recetado. Estas sustancias qumicas afectan la formacin y el desarrollo del beb.  No consuma ningn producto que contenga tabaco, lo que incluye cigarrillos, tabaco de mascar y cigarrillos electrnicos. Si necesita ayuda para dejar de fumar, consulte al mdico. Puede recibir asesoramiento y otro tipo de recursos para dejar de fumar.  Siga las indicaciones del mdico en relacin con el uso de medicamentos. Durante el embarazo, hay medicamentos que son seguros de tomar y otros que no.  Haga ejercicio solamente como se lo haya indicado el mdico. Sentir clicos uterinos es un buen signo para detener la actividad fsica.  Contine comiendo alimentos sanos con  regularidad.  Use un sostn que le brinde buen soporte si le duelen las mamas.  No se d baos de inmersin en agua caliente, baos turcos ni saunas.  Use el cinturn de seguridad en todo momento mientras conduce.  No coma carne cruda ni queso sin cocinar; evite el contacto con las bandejas sanitarias de los gatos y la tierra que estos animales usan. Estos elementos contienen grmenes que pueden causar defectos congnitos en el beb.  Tome las vitaminas prenatales.  Tome entre 1500 y 2000mg de calcio diariamente comenzando en la semana20 del embarazo hasta el parto.  Si est estreida, pruebe un laxante suave (si el mdico lo autoriza). Consuma ms alimentos ricos en fibra, como vegetales y frutas frescos y cereales integrales. Beba gran cantidad de lquido para mantener la orina de tono claro o color amarillo plido.  Dese baos de asiento con agua tibia para aliviar el dolor o las molestias causadas por las hemorroides. Use una crema para las hemorroides si el mdico la autoriza.  Si tiene venas varicosas, use medias de descanso. Eleve los pies durante 15minutos, 3 o 4veces por da. Limite el consumo de sal en su dieta.  Evite levantar objetos pesados, use zapatos de tacones bajos y mantenga una buena postura.  Descanse   con las piernas elevadas si tiene calambres o dolor de cintura.  Visite a su dentista si no lo ha hecho durante el embarazo. Use un cepillo de dientes blando para higienizarse los dientes y psese el hilo dental con suavidad.  Puede seguir manteniendo relaciones sexuales, a menos que el mdico le indique lo contrario.  No haga viajes largos excepto que sea absolutamente necesario y solo con la autorizacin del mdico.  Tome clases prenatales para entender, practicar y hacer preguntas sobre el trabajo de parto y el parto.  Haga un ensayo de la partida al hospital.  Prepare el bolso que llevar al hospital.  Prepare la habitacin del beb.  Concurra a todas  las visitas prenatales segn las indicaciones de su mdico. SOLICITE ATENCIN MDICA SI:  No est segura de que est en trabajo de parto o de que ha roto la bolsa de las aguas.  Tiene mareos.  Siente clicos leves, presin en la pelvis o dolor persistente en el abdomen.  Tiene nuseas, vmitos o diarrea persistentes.  Observa una secrecin vaginal con mal olor.  Siente dolor al orinar. SOLICITE ATENCIN MDICA DE INMEDIATO SI:   Tiene fiebre.  Tiene una prdida de lquido por la vagina.  Tiene sangrado o pequeas prdidas vaginales.  Siente dolor intenso o clicos en el abdomen.  Sube o baja de peso rpidamente.  Tiene dificultad para respirar y siente dolor de pecho.  Sbitamente se le hinchan mucho el rostro, las manos, los tobillos, los pies o las piernas.  No ha sentido los movimientos del beb durante una hora.  Siente un dolor de cabeza intenso que no se alivia con medicamentos.  Su visin se modifica.   Esta informacin no tiene como fin reemplazar el consejo del mdico. Asegrese de hacerle al mdico cualquier pregunta que tenga.   Document Released: 03/04/2005 Document Revised: 06/15/2014 Elsevier Interactive Patient Education 2016 [employer]  

## 2015-04-05 LAB — GC/CHLAMYDIA PROBE AMP (~~LOC~~) NOT AT ARMC
CHLAMYDIA, DNA PROBE: NEGATIVE
Neisseria Gonorrhea: NEGATIVE

## 2015-04-08 ENCOUNTER — Ambulatory Visit (INDEPENDENT_AMBULATORY_CARE_PROVIDER_SITE_OTHER): Payer: Medicaid Other | Admitting: *Deleted

## 2015-04-08 VITALS — BP 114/70 | HR 78

## 2015-04-08 DIAGNOSIS — O24913 Unspecified diabetes mellitus in pregnancy, third trimester: Secondary | ICD-10-CM

## 2015-04-08 LAB — CULTURE, BETA STREP (GROUP B ONLY)

## 2015-04-08 NOTE — Progress Notes (Signed)
Interpreter Nile RiggsMariel Gallego present for encounter. US for growth scheduled 11/7.

## 2015-04-08 NOTE — Progress Notes (Signed)
NST reactive.

## 2015-04-11 ENCOUNTER — Ambulatory Visit (INDEPENDENT_AMBULATORY_CARE_PROVIDER_SITE_OTHER): Payer: Medicaid Other | Admitting: Obstetrics & Gynecology

## 2015-04-11 VITALS — BP 123/77 | HR 77 | Wt 187.3 lb

## 2015-04-11 DIAGNOSIS — O24913 Unspecified diabetes mellitus in pregnancy, third trimester: Secondary | ICD-10-CM | POA: Diagnosis not present

## 2015-04-11 DIAGNOSIS — O09893 Supervision of other high risk pregnancies, third trimester: Secondary | ICD-10-CM

## 2015-04-11 DIAGNOSIS — O24313 Unspecified pre-existing diabetes mellitus in pregnancy, third trimester: Secondary | ICD-10-CM

## 2015-04-11 LAB — POCT URINALYSIS DIP (DEVICE)
BILIRUBIN URINE: NEGATIVE
Glucose, UA: NEGATIVE mg/dL
Hgb urine dipstick: NEGATIVE
Ketones, ur: NEGATIVE mg/dL
NITRITE: NEGATIVE
Protein, ur: NEGATIVE mg/dL
Specific Gravity, Urine: 1.025 (ref 1.005–1.030)
Urobilinogen, UA: 0.2 mg/dL (ref 0.0–1.0)
pH: 6.5 (ref 5.0–8.0)

## 2015-04-11 NOTE — Progress Notes (Signed)
Subjective:  Rebecca Holder is a 24 y.o. G2P1001 at 7753w1d being seen today for ongoing prenatal care. Patient is Spanish-speaking only, Spanish interpreter present for this encounter.  Patient reports no complaints.  Contractions: Irregular.  Vag. Bleeding: None. Movement: Present. Denies leaking of fluid.   The following portions of the patient's history were reviewed and updated as appropriate: allergies, current medications, past family history, past medical history, past social history, past surgical history and problem list. Problem list updated.  Objective:   Filed Vitals:   04/11/15 0853  BP: 123/77  Pulse: 77  Weight: 187 lb 4.8 oz (84.959 kg)    Fetal Status: Fetal Heart Rate (bpm): NST Fundal Height: 37 cm Movement: Present  Presentation: Vertex  General:  Alert, oriented and cooperative. Patient is in no acute distress.  Skin: Skin is warm and dry. No rash noted.   Cardiovascular: Normal heart rate noted  Respiratory: Normal respiratory effort, no problems with respiration noted  Abdomen: Soft, gravid, appropriate for gestational age. Pain/Pressure: Present     Pelvic: Vag. Bleeding: None     Cervical exam deferred        Extremities: Normal range of motion.  Edema: None  Mental Status: Normal mood and affect. Normal behavior. Normal judgment and thought content.   Urinalysis: Urine Protein: Negative Urine Glucose: Negative NST performed today was reviewed and was found to be reactive.  AFI 10.2 cm. .  Assessment and Plan:  Pregnancy: G2P1001 at 4453w1d  1. Diabetes mellitus in pregnancy in third trimester Continue recommended antenatal testing and prenatal care.  Growth scan next week. Blood sugars are notable for a few fastings in 40-50s.  Patient instructed to take 1/2 tablet at night: new regimen is Glyburide 2.5 mg at breakfast, 1.25 mg at bedtime  2. Supervision of other high risk pregnancy, antepartum, third trimester Term labor symptoms and general  obstetric precautions including but not limited to vaginal bleeding, contractions, leaking of fluid and fetal movement were reviewed in detail with the patient. Please refer to After Visit Summary for other counseling recommendations.  Return in about 4 days (around 04/15/2015) for 2x/wk as scheduled.   Tereso NewcomerUgonna A Lorrinda Ramstad, MD

## 2015-04-11 NOTE — Progress Notes (Signed)
[name] present for encounter.  Breastfeeding tip of the week reviewed. US for growth scheduled 04/15/15.

## 2015-04-11 NOTE — Patient Instructions (Signed)
Return to clinic for any obstetric concerns or go to [facility] for evaluation  

## 2015-04-15 ENCOUNTER — Ambulatory Visit (INDEPENDENT_AMBULATORY_CARE_PROVIDER_SITE_OTHER): Payer: Medicaid Other | Admitting: Family Medicine

## 2015-04-15 ENCOUNTER — Other Ambulatory Visit: Payer: Self-pay

## 2015-04-15 ENCOUNTER — Ambulatory Visit (HOSPITAL_COMMUNITY)
Admission: RE | Admit: 2015-04-15 | Discharge: 2015-04-15 | Disposition: A | Discharge status: Home / Self Care | Payer: Self-pay | Source: Ambulatory Visit | Attending: Family Medicine | Admitting: Family Medicine

## 2015-04-15 ENCOUNTER — Encounter (HOSPITAL_COMMUNITY): Payer: Self-pay

## 2015-04-15 VITALS — BP 105/69 | HR 92 | Wt 189.0 lb

## 2015-04-15 DIAGNOSIS — Z3A37 37 weeks gestation of pregnancy: Secondary | ICD-10-CM | POA: Insufficient documentation

## 2015-04-15 DIAGNOSIS — O24912 Unspecified diabetes mellitus in pregnancy, second trimester: Secondary | ICD-10-CM

## 2015-04-15 DIAGNOSIS — O24913 Unspecified diabetes mellitus in pregnancy, third trimester: Secondary | ICD-10-CM

## 2015-04-15 DIAGNOSIS — O24313 Unspecified pre-existing diabetes mellitus in pregnancy, third trimester: Secondary | ICD-10-CM

## 2015-04-15 DIAGNOSIS — O24415 Gestational diabetes mellitus in pregnancy, controlled by oral hypoglycemic drugs: Secondary | ICD-10-CM | POA: Insufficient documentation

## 2015-04-15 DIAGNOSIS — O34219 Maternal care for unspecified type scar from previous cesarean delivery: Secondary | ICD-10-CM

## 2015-04-15 DIAGNOSIS — O09893 Supervision of other high risk pregnancies, third trimester: Secondary | ICD-10-CM

## 2015-04-15 DIAGNOSIS — O2343 Unspecified infection of urinary tract in pregnancy, third trimester: Secondary | ICD-10-CM

## 2015-04-15 LAB — POCT URINALYSIS DIP (DEVICE)
Bilirubin Urine: NEGATIVE
GLUCOSE, UA: NEGATIVE mg/dL
Hgb urine dipstick: NEGATIVE
Ketones, ur: NEGATIVE mg/dL
NITRITE: POSITIVE — AB
Protein, ur: NEGATIVE mg/dL
SPECIFIC GRAVITY, URINE: 1.025 (ref 1.005–1.030)
UROBILINOGEN UA: 0.2 mg/dL (ref 0.0–1.0)
pH: 6 (ref 5.0–8.0)

## 2015-04-15 MED ORDER — GLYBURIDE 2.5 MG PO TABS: ORAL_TABLET | ORAL | Status: DC

## 2015-04-15 NOTE — Progress Notes (Signed)
Interpreter Rebecca Holder present for encounter.  Positive Nitrites and trace leukocytes on udip today - pt denies sx of UTI.  Urine culture sent to lab.  Pt states she has had some low blood sugar values.  Breastfeeding tip of the week reviewed. US for growth done today.  IOL scheduled 11/16

## 2015-04-15 NOTE — Patient Instructions (Signed)
Tercer trimestre de embarazo (Third Trimester of Pregnancy) El tercer trimestre comprende desde la semana29 hasta la semana42, es decir, desde el mes7 hasta el mes9. El tercer trimestre es un perodo en el que el feto crece rpidamente. Hacia el final del noveno mes, el feto mide alrededor de 20pulgadas (45cm) de largo y pesa entre 6 y 10 libras (2,700 y 4,500kg).  CAMBIOS EN EL ORGANISMO Su organismo atraviesa por muchos cambios durante el embarazo, y estos varan de una mujer a otra.   Seguir aumentando de peso. Es de esperar que aumente entre 25 y 35libras (11 y 16kg) hacia el final del embarazo.  Podrn aparecer las primeras estras en las caderas, el abdomen y las mamas.  Puede tener necesidad de orinar con ms frecuencia porque el feto baja hacia la pelvis y ejerce presin sobre la vejiga.  Debido al embarazo podr sentir acidez estomacal con frecuencia.  Puede estar estreida, ya que ciertas hormonas enlentecen los movimientos de los msculos que empujan los desechos a travs de los intestinos.  Pueden aparecer hemorroides o abultarse e hincharse las venas (venas varicosas).  Puede sentir dolor plvico debido al aumento de peso y a que las hormonas del embarazo relajan las articulaciones entre los huesos de la pelvis. El dolor de espalda puede ser consecuencia de la sobrecarga de los msculos que soportan la postura.  Tal vez haya cambios en el cabello que pueden incluir su engrosamiento, crecimiento rpido y cambios en la textura. Adems, a algunas mujeres se les cae el cabello durante o despus del embarazo, o tienen el cabello seco o fino. Lo ms probable es que el cabello se le normalice despus del nacimiento del beb.  Las mamas seguirn creciendo y le dolern. A veces, puede haber una secrecin amarilla de las mamas llamada calostro.  El ombligo puede salir hacia afuera.  Puede sentir que le falta el aire debido a que se expande el tero.  Puede notar que el feto  "baja" o lo siente ms bajo, en el abdomen.  Puede tener una prdida de secrecin mucosa con sangre. Esto suele ocurrir en el trmino de unos pocos das a una semana antes de que comience el trabajo de parto.  El cuello del tero se vuelve delgado y blando (se borra) cerca de la fecha de parto. QU DEBE ESPERAR EN LOS EXMENES PRENATALES  Le harn exmenes prenatales cada 2semanas hasta la semana36. A partir de ese momento le harn exmenes semanales. Durante una visita prenatal de rutina:  La pesarn para asegurarse de que usted y el feto estn creciendo normalmente.  Le tomarn la presin arterial.  Le medirn el abdomen para controlar el desarrollo del beb.  Se escucharn los latidos cardacos fetales.  Se evaluarn los resultados de los estudios solicitados en visitas anteriores.  Le revisarn el cuello del tero cuando est prxima la fecha de parto para controlar si este se ha borrado. Alrededor de la semana36, el mdico le revisar el cuello del tero. Al mismo tiempo, realizar un anlisis de las secreciones del tejido vaginal. Este examen es para determinar si hay un tipo de bacteria, estreptococo Grupo B. El mdico le explicar esto con ms detalle. El mdico puede preguntarle lo siguiente:  Cmo le gustara que fuera el parto.  Cmo se siente.  Si siente los movimientos del beb.  Si ha tenido sntomas anormales, como prdida de lquido, sangrado, dolores de cabeza intensos o clicos abdominales.  Si est consumiendo algn producto que contenga tabaco, como cigarrillos, tabaco   de mascar y cigarrillos electrnicos.  Si tiene alguna pregunta. Otros exmenes o estudios de deteccin que pueden realizarse durante el tercer trimestre incluyen lo siguiente:  Anlisis de sangre para controlar los niveles de hierro (anemia).  Controles fetales para determinar su salud, nivel de actividad y crecimiento. Si tiene alguna enfermedad o hay problemas durante el embarazo, le harn  estudios.  Prueba del VIH (virus de inmunodeficiencia humana). Si corre un riesgo alto, pueden realizarle una prueba de deteccin del VIH durante el tercer trimestre del embarazo. FALSO TRABAJO DE PARTO Es posible que sienta contracciones leves e irregulares que finalmente desaparecen. Se llaman contracciones de Braxton Hicks o falso trabajo de parto. Las contracciones pueden durar horas, das o incluso semanas, antes de que el verdadero trabajo de parto se inicie. Si las contracciones ocurren a intervalos regulares, se intensifican o se hacen dolorosas, lo mejor es que la revise el mdico.  SIGNOS DE TRABAJO DE PARTO   Clicos de tipo menstrual.  Contracciones cada 5minutos o menos.  Contracciones que comienzan en la parte superior del tero y se extienden hacia abajo, a la zona inferior del abdomen y la espalda.  Sensacin de mayor presin en la pelvis o dolor de espalda.  Una secrecin de mucosidad acuosa o con sangre que sale de la vagina. Si tiene alguno de estos signos antes de la semana37 del embarazo, llame a su mdico de inmediato. Debe concurrir al hospital para que la controlen inmediatamente. INSTRUCCIONES PARA EL CUIDADO EN EL HOGAR   Evite fumar, consumir hierbas, beber alcohol y tomar frmacos que no le hayan recetado. Estas sustancias qumicas afectan la formacin y el desarrollo del beb.  No consuma ningn producto que contenga tabaco, lo que incluye cigarrillos, tabaco de mascar y cigarrillos electrnicos. Si necesita ayuda para dejar de fumar, consulte al mdico. Puede recibir asesoramiento y otro tipo de recursos para dejar de fumar.  Siga las indicaciones del mdico en relacin con el uso de medicamentos. Durante el embarazo, hay medicamentos que son seguros de tomar y otros que no.  Haga ejercicio solamente como se lo haya indicado el mdico. Sentir clicos uterinos es un buen signo para detener la actividad fsica.  Contine comiendo alimentos sanos con  regularidad.  Use un sostn que le brinde buen soporte si le duelen las mamas.  No se d baos de inmersin en agua caliente, baos turcos ni saunas.  Use el cinturn de seguridad en todo momento mientras conduce.  No coma carne cruda ni queso sin cocinar; evite el contacto con las bandejas sanitarias de los gatos y la tierra que estos animales usan. Estos elementos contienen grmenes que pueden causar defectos congnitos en el beb.  Tome las vitaminas prenatales.  Tome entre 1500 y 2000mg de calcio diariamente comenzando en la semana20 del embarazo hasta el parto.  Si est estreida, pruebe un laxante suave (si el mdico lo autoriza). Consuma ms alimentos ricos en fibra, como vegetales y frutas frescos y cereales integrales. Beba gran cantidad de lquido para mantener la orina de tono claro o color amarillo plido.  Dese baos de asiento con agua tibia para aliviar el dolor o las molestias causadas por las hemorroides. Use una crema para las hemorroides si el mdico la autoriza.  Si tiene venas varicosas, use medias de descanso. Eleve los pies durante 15minutos, 3 o 4veces por da. Limite el consumo de sal en su dieta.  Evite levantar objetos pesados, use zapatos de tacones bajos y mantenga una buena postura.  Descanse   con las piernas elevadas si tiene calambres o dolor de cintura.  Visite a su dentista si no lo ha hecho durante el embarazo. Use un cepillo de dientes blando para higienizarse los dientes y psese el hilo dental con suavidad.  Puede seguir manteniendo relaciones sexuales, a menos que el mdico le indique lo contrario.  No haga viajes largos excepto que sea absolutamente necesario y solo con la autorizacin del mdico.  Tome clases prenatales para entender, practicar y hacer preguntas sobre el trabajo de parto y el parto.  Haga un ensayo de la partida al hospital.  Prepare el bolso que llevar al hospital.  Prepare la habitacin del beb.  Concurra a todas  las visitas prenatales segn las indicaciones de su mdico. SOLICITE ATENCIN MDICA SI:  No est segura de que est en trabajo de parto o de que ha roto la bolsa de las aguas.  Tiene mareos.  Siente clicos leves, presin en la pelvis o dolor persistente en el abdomen.  Tiene nuseas, vmitos o diarrea persistentes.  Observa una secrecin vaginal con mal olor.  Siente dolor al orinar. SOLICITE ATENCIN MDICA DE INMEDIATO SI:   Tiene fiebre.  Tiene una prdida de lquido por la vagina.  Tiene sangrado o pequeas prdidas vaginales.  Siente dolor intenso o clicos en el abdomen.  Sube o baja de peso rpidamente.  Tiene dificultad para respirar y siente dolor de pecho.  Sbitamente se le hinchan mucho el rostro, las manos, los tobillos, los pies o las piernas.  No ha sentido los movimientos del beb durante una hora.  Siente un dolor de cabeza intenso que no se alivia con medicamentos.  Su visin se modifica.   Esta informacin no tiene como fin reemplazar el consejo del mdico. Asegrese de hacerle al mdico cualquier pregunta que tenga.   Document Released: 03/04/2005 Document Revised: 06/15/2014 Elsevier Interactive Patient Education 2016 [employer]  

## 2015-04-15 NOTE — Progress Notes (Signed)
Subjective:  Rebecca Holder is a 24 y.o. G2P1001 at 6668w5d being seen today for ongoing prenatal care.  Patient reports no complaints.  Contractions: Irregular.  Vag. Bleeding: None. Movement: Present. Denies leaking of fluid.   The following portions of the patient's history were reviewed and updated as appropriate: allergies, current medications, past family history, past medical history, past social history, past surgical history and problem list. Problem list updated.  Objective:   Filed Vitals:   04/15/15 1023  BP: 105/69  Pulse: 92  Weight: 189 lb (85.73 kg)    Fetal Status: Fetal Heart Rate (bpm): NST   Movement: Present     General:  Alert, oriented and cooperative. Patient is in no acute distress.  Skin: Skin is warm and dry. No rash noted.   Cardiovascular: Normal heart rate noted  Respiratory: Normal respiratory effort, no problems with respiration noted  Abdomen: Soft, gravid, appropriate for gestational age. Pain/Pressure: Present     Pelvic: Vag. Bleeding: None     Cervical exam deferred        Extremities: Normal range of motion.  Edema: None  Mental Status: Normal mood and affect. Normal behavior. Normal judgment and thought content.   Urinalysis: Urine Protein: Negative Urine Glucose: Negative  Fasting 42-72 2 hr pp 90-192, mostly 110-140  Assessment and Plan:  Pregnancy: G2P1001 at 6368w5d  1. Diabetes mellitus affecting pregnancy in third trimester - Culture, OB Urine - Fetal nonstress test - No changes in medications - IOL 11/16 2. Previous cesarean delivery, antepartum -TOLAC signed 10/13 -VBAC success 32%  3. Supervision of other high risk pregnancy, antepartum, third trimester -updated box  Term labor symptoms and general obstetric precautions including but not limited to vaginal bleeding, contractions, leaking of fluid and fetal movement were reviewed in detail with the patient. Please refer to After Visit Summary for other counseling  recommendations.  Return in about 3 days (around 04/18/2015) for 2x/wk as scheduled.   Federico FlakeKimberly Niles Newton, MD  Future Appointments Date Time Provider Department Center  04/18/2015 1:00 PM [facility] NST [facility]  04/22/2015 8:30 AM [facility] NST [facility]OC  04/24/2015 6:30 AM [facility] ROOM [facility] None

## 2015-04-17 ENCOUNTER — Telehealth: Payer: Self-pay | Admitting: *Deleted

## 2015-04-17 DIAGNOSIS — O2340 Unspecified infection of urinary tract in pregnancy, unspecified trimester: Secondary | ICD-10-CM | POA: Insufficient documentation

## 2015-04-17 LAB — CULTURE, OB URINE: Colony Count: >=100000

## 2015-04-17 MED ORDER — NITROFURANTOIN MONOHYD MACRO 100 MG PO CAPS: 100.0000 mg | ORAL_CAPSULE | Freq: Two times a day (BID) | ORAL | Status: DC

## 2015-04-17 NOTE — Telephone Encounter (Signed)
Called pt with Rebecca RoyalsJulie Holder and left message that she has UTI and Rx has been sent to her pharmacy.

## 2015-04-17 NOTE — Addendum Note (Signed)
Addended by: Geanie BerlinNEWTON, KIMBERLY N on: 04/17/2015 01:39 PM   Modules accepted: Orders

## 2015-04-18 ENCOUNTER — Ambulatory Visit (INDEPENDENT_AMBULATORY_CARE_PROVIDER_SITE_OTHER): Payer: Medicaid Other | Admitting: *Deleted

## 2015-04-18 VITALS — BP 111/67 | HR 91

## 2015-04-18 DIAGNOSIS — O24913 Unspecified diabetes mellitus in pregnancy, third trimester: Secondary | ICD-10-CM

## 2015-04-18 DIAGNOSIS — O24313 Unspecified pre-existing diabetes mellitus in pregnancy, third trimester: Secondary | ICD-10-CM

## 2015-04-18 NOTE — Progress Notes (Signed)
Interpreter [employer] Mora present for encounter.   Pt advised of UTI and need to pick up antibiotic Rx. She voiced understanding.

## 2015-04-18 NOTE — Progress Notes (Signed)
NST reviewed and reactive.  

## 2015-04-19 ENCOUNTER — Telehealth (HOSPITAL_COMMUNITY): Payer: Self-pay | Admitting: *Deleted

## 2015-04-19 NOTE — Telephone Encounter (Signed)
Preadmission screen  

## 2015-04-22 ENCOUNTER — Ambulatory Visit (INDEPENDENT_AMBULATORY_CARE_PROVIDER_SITE_OTHER): Payer: Medicaid Other | Admitting: Family Medicine

## 2015-04-22 VITALS — BP 117/75 | HR 72 | Wt 186.8 lb

## 2015-04-22 DIAGNOSIS — O24313 Unspecified pre-existing diabetes mellitus in pregnancy, third trimester: Secondary | ICD-10-CM

## 2015-04-22 DIAGNOSIS — O09893 Supervision of other high risk pregnancies, third trimester: Secondary | ICD-10-CM

## 2015-04-22 DIAGNOSIS — O24913 Unspecified diabetes mellitus in pregnancy, third trimester: Secondary | ICD-10-CM

## 2015-04-22 LAB — POCT URINALYSIS DIP (DEVICE)
GLUCOSE, UA: NEGATIVE mg/dL
HGB URINE DIPSTICK: NEGATIVE
Ketones, ur: NEGATIVE mg/dL
Nitrite: NEGATIVE
PROTEIN: 30 mg/dL — AB
UROBILINOGEN UA: 1 mg/dL (ref 0.0–1.0)
pH: 6 (ref 5.0–8.0)

## 2015-04-22 NOTE — Patient Instructions (Addendum)
Lactancia materna (Breastfeeding) Decidir Museum/gallery exhibitions officer es una de las mejores elecciones que puede hacer por usted y su beb. El cambio hormonal durante el Psychiatrist produce el desarrollo del tejido mamario y [place] la cantidad y el tamao de los conductos galactforos. Estas hormonas tambin permiten que las protenas, los azcares y las grasas de la sangre produzcan la [employer] materna en las glndulas productoras de [place]. Las hormonas impiden que la leche materna sea liberada antes del nacimiento del beb, adems de impulsar el flujo de leche luego del nacimiento. Una vez que ha comenzado a Museum/gallery exhibitions officer, Conservation officer, nature beb, as Immunologist succin o Theatre manager, pueden estimular la liberacin de [place] de las glndulas productoras de [place].  LOS BENEFICIOS DE AMAMANTAR Para el beb  La primera leche (calostro) ayuda a Careers information officer funcionamiento del sistema digestivo del beb.  La leche tiene anticuerpos que ayudan a Radio producer las infecciones en el beb.  El beb tiene una menor incidencia de asma, alergias y del sndrome de muerte sbita del lactante.  Los nutrientes en la [place] materna son mejores para el beb que la [place] maternizada y estn preparados exclusivamente para cubrir las necesidades del beb.  La leche materna mejora el desarrollo cerebral del beb.  Es menos probable que el beb desarrolle otras enfermedades, como obesidad infantil, asma o diabetes mellitus de tipo 2. Para usted   La lactancia materna favorece el desarrollo de un vnculo muy especial entre la madre y el beb.  Es conveniente. La leche materna siempre est disponible a la Human resources officer y es [place].  La lactancia materna ayuda a quemar caloras y a perder el peso ganado durante el [place].  Favorece la contraccin del tero al tamao que tena antes del embarazo de manera ms rpida y disminuye el sangrado (loquios) despus del parto.  La lactancia materna contribuye a reducir Nurse, adult de desarrollar diabetes  mellitus de tipo 2, osteoporosis o cncer de mama o de ovario en el futuro. SIGNOS DE QUE EL BEB EST HAMBRIENTO Primeros signos de [address] de [place].  Se estira.  Mueve la cabeza de un lado a otro.  Mueve la cabeza y abre la boca cuando se le toca la mejilla o la comisura de la boca (reflejo de bsqueda).  Aumenta las vocalizaciones, tales como sonidos de succin, se relame los labios, emite arrullos, suspiros, o chirridos.  Mueve la [employer] boca.  Se chupa con ganas los dedos o las manos. Signos tardos de Fisher [employer].  Llora de manera intermitente. Signos de [employer] Corporation signos de hambre extrema requerirn que lo calme y lo consuele antes de que el beb pueda alimentarse adecuadamente. No espere a que se manifiesten los siguientes signos de hambre extrema para comenzar a Museum/gallery exhibitions officer:   Designer, jewellery.  Llanto intenso y fuerte.  Gritos. INFORMACIN BSICA SOBRE LA LACTANCIA MATERNA Iniciacin de la lactancia materna  Encuentre un lugar cmodo para sentarse o acostarse, con un buen respaldo para el cuello y la espalda.  Coloque una almohada o una manta enrollada debajo del beb para acomodarlo a la altura de la mama (si est sentada). Las almohadas para Museum/gallery exhibitions officer se han diseado especialmente a fin de servir de apoyo para los brazos y el beb [employer].  Asegrese de que el abdomen del beb est frente al suyo.   Masajee suavemente la mama. Con las yemas de los dedos, masajee la pared del pecho hacia el pezn en un movimiento circular.  Esto estimula el flujo de [place]. Es posible que Engineer, manufacturing systemsdeba continuar este movimiento mientras amamanta si la leche fluye lentamente.  Sostenga la mama con el pulgar por arriba del pezn y los otros 4 dedos por debajo de la mama. Asegrese de que los dedos se encuentren lejos del pezn y de la boca del beb.  Empuje suavemente los labios del beb con el pezn o con el dedo.  Cuando la boca del  beb se abra lo suficiente, acrquelo rpidamente a la mama e introduzca todo el pezn y la zona oscura que lo rodea (areola), tanto como sea posible, dentro de la boca del beb.  Debe haber ms areola visible por arriba del labio superior del beb que por debajo del labio inferior.  La lengua del beb debe estar entre la enca inferior y la [place]mama.  Asegrese de que la boca del beb est en la posicin correcta alrededor del pezn (prendida). Los labios del beb deben crear un sello sobre la mama y estar doblados hacia afuera (invertidos).  Es comn que el beb succione durante 2 a 3 minutos para que comience el flujo de [place] materna. Cmo debe prenderse Es muy importante que le ensee al beb cmo prenderse adecuadamente a la mama. Si el beb no se prende adecuadamente, puede causarle dolor en el pezn y reducir la produccin de Paradise Valleyleche materna, y hacer que el beb tenga un escaso aumento de [place]o. Adems, si el beb no se prende adecuadamente al pezn, puede tragar aire durante la alimentacin. Esto puede causarle molestias al beb. Hacer eructar al beb al Pilar Platecambiar de mama puede ayudarlo a liberar el aire. Sin embargo, ensearle al beb cmo prenderse a la mama adecuadamente es la mejor manera de evitar que se sienta molesto por tragar Oceanographeraire mientras se alimenta. Signos de que el beb se ha prendido adecuadamente al pezn:   Payton Doughtyironea o succiona de modo silencioso, sin causarle dolor.  Se escucha que traga cada 3 o 4 succiones.  Hay movimientos musculares por arriba y por delante de sus odos al Printmakersuccionar. Signos de que el beb no se ha prendido Audiological scientistadecuadamente al pezn:   Hace ruidos de succin o de chasquido mientras se alimenta.  Siente dolor en el pezn. Si cree que el beb no se prendi correctamente, deslice el dedo en la comisura de la boca y Ameren Corporationcolquelo entre las encas del beb para interrumpir la succin. Intente comenzar a amamantar nuevamente. Signos de Fish farm managerlactancia materna exitosa Signos  del beb:   Disminuye gradualmente el nmero de succiones o cesa la succin por completo.  Se duerme.  Relaja el cuerpo.  Retiene una pequea cantidad de Kindred Healthcareleche en la boca.  Se desprende solo del pecho. Signos que presenta usted:  Las mamas han aumentado la firmeza, el peso y el tamao 1 a 3 horas despus de Museum/gallery exhibitions officeramamantar.  Estn ms blandas inmediatamente despus de amamantar.  Un aumento del volumen de [place], y tambin un cambio en su consistencia y color se producen hacia el quinto da de Tour managerlactancia materna.  Los pezones no duelen, ni estn agrietados ni sangran. Signos de que su beb recibe la cantidad de leche suficiente  Moja al menos 3 paales en 24 horas. La orina debe ser clara y de color amarillo plido a los [address]  [address] de [place]ida.  Defeca al menos 3 veces en 24 horas a los [address]  [address] de [address]. La materia fecal debe ser blanda y Grand Detouramarillenta.  Defeca al menos 3 veces en 24 horas a los  7 das de [address]. La materia fecal debe ser grumosa y Laurelamarillenta.  No registra una prdida de peso mayor del 10% del peso al nacer durante los primeros 3 [address]  [address] de [place]ida.  Aumenta de peso un promedio de 4 a 7onzas (113 a 198g) por semana despus de los 4 [address]  [address] de vida.  Aumenta de Mount Oliverpeso, Hiltondiariamente, de Senatobiamanera uniforme a Glass blower/designerpartir de los 5 [address]  [address] de vida, sin Passenger transport managerregistrar prdida de peso despus de las 2semanas de vida. Despus de alimentarse, es posible que el beb regurgite una pequea cantidad. Esto es frecuente. FRECUENCIA Y DURACIN DE LA LACTANCIA MATERNA El amamantamiento frecuente la ayudar a producir ms [place]leche y a Education officer, communityprevenir problemas de Engineer, miningdolor en los pezones e hinchazn en las Mer[place]mamas. Alimente al beb cuando muestre signos de hambre o si siente la necesidad de reducir la congestin de las Lake Minchuminamamas. Esto se denomina "lactancia a demanda". Evite el uso del chupete mientras trabaja para establecer la lactancia (las primeras 4 a 6 semanas despus del nacimiento del beb). Despus de este perodo, podr ofrecerle un  chupete. Las investigaciones demostraron que el uso del chupete durante el primer ao de vida del beb disminuye el riesgo de desarrollar el sndrome de muerte sbita del lactante (SMSL). Permita que el nio se alimente en cada mama todo lo que desee. Contine amamantando al beb hasta que haya terminado de alimentarse. Cuando el beb se desprende o se queda dormido mientras se est alimentando de la primera mama, ofrzcale la segunda. Debido a que, con frecuencia, los recin Sunoconacidos permanecen somnolientos las primeras semanas de vida, es posible que deba despertar al beb para alimentarlo. Los horarios de Acupuncturistlactancia varan de un beb a otro. Sin embargo, las siguientes reglas pueden servir como gua para ayudarla a Lawyergarantizar que el beb se alimenta adecuadamente:  Se puede amamantar a los recin nacidos (bebs de 4 semanas o menos de vida) cada 1 a 3 horas.  No deben transcurrir ms de 3 horas durante el da o 5 horas durante la noche sin que se amamante a los recin nacidos.  Debe amamantar al beb 8 veces como mnimo en un perodo de 24 horas, hasta que comience a introducir slidos en su dieta, a los 6 meses de vida aproximadamente. EXTRACCIN DE Dean Foods CompanyLECHE MATERNA La extraccin y Contractorel almacenamiento de la leche materna le permiten asegurarse de que el beb se alimente exclusivamente de [place] materna, aun en momentos en los que no puede amamantar. Esto tiene especial importancia si debe regresar al Aleen Campitrabajo en el perodo en que an est amamantando o si no puede estar presente en los momentos en que el beb debe alimentarse. Su asesor en lactancia puede orientarla sobre cunto tiempo es seguro almacenar [place]le[place] materna.  El sacaleche es un aparato que le permite extraer leche de la mama a un recipiente estril. Luego, la leche materna extrada puede almacenarse en un refrigerador o Electrical engineercongelador. Algunos sacaleches son Birdie Riddlemanuales, Delaney Meigsmientras que otros son elctricos. Consulte a su asesor en lactancia qu tipo ser  ms conveniente para usted. Los sacaleches se pueden comprar; sin embargo, algunos hospitales y grupos de apoyo a la lactancia materna alquilan Sports coachsacaleches mensualmente. Un asesor en lactancia puede ensearle cmo extraer W. R. Berkleyleche materna manualmente, en caso de que prefiera no usar un sacaleche.  CMO CUIDAR LAS MAMAS DURANTE LA LACTANCIA MATERNA Los pezones se secan, agrietan y duelen durante la Tour managerlactancia materna. Las siguientes recomendaciones pueden ayudarla a Pharmacologistmantener las TEPPCO Partnersmamas humectadas y sanas:  Careers information officervite usar jabn en los pezones.  Use un sostn de soporte. Aunque no son esenciales, las camisetas sin mangas o los sostenes especiales para Museum/gallery exhibitions officer estn diseados para acceder fcilmente a las mamas, para Museum/gallery exhibitions officer sin tener que quitarse todo el sostn o la camiseta. Evite usar sostenes con aro o sostenes muy ajustados.  Seque al aire sus pezones durante 3 a despus de amamantar al beb.  Utilice solo apsitos de Haematologist sostn para Environmental health practitioner las prdidas de [place]. La prdida de un poco de [employer] Group tomas es normal.  Utilice lanolina sobre los pezones luego de Museum/gallery exhibitions officer. La lanolina ayuda a mantener la humedad normal de la piel. Si [place] lanolina pura, no tiene que lavarse los pezones antes de volver a Corporate treasurer al beb. La lanolina pura no es txica para el beb. Adems, puede extraer [employer] algunas gotas de [place] materna y Engineer, maintenance (IT) suavemente esa [employer], para que la [place] se seque al aire. Durante las primeras semanas despus de dar a luz, algunas mujeres pueden experimentar hinchazn en las mamas (congestin [place]). La congestin puede hacer que sienta las mamas pesadas, calientes y sensibles al tacto. El pico de la congestin ocurre dentro de los 3 a 5 das despus del [place]. Las siguientes recomendaciones pueden ayudarla a Paramedic la congestin:  Vace por completo las mamas al QUALCOMM o Environmental health practitioner. Puede aplicar calor hmedo en las mamas  (en la ducha o con toallas hmedas para manos) antes de Museum/gallery exhibitions officer o extraer [employer] Resources. Esto aumenta la circulacin y [place] a que la [place]. Si el beb no vaca por completo las [address] cuando lo [address], extraiga la [place] restante despus de que haya finalizado.  Use un sostn ajustado (para amamantar o comn) o una camiseta sin mangas durante 1 o 2 das para indicar al cuerpo que disminuya ligeramente la produccin de [place].  Aplique compresas de hielo Yahoo! Inc, a menos que le resulte demasiado incmodo.  Asegrese de que el beb est prendido y se encuentre en la posicin correcta mientras lo alimenta. Si la congestin persiste luego de 48 horas o despus de seguir estas recomendaciones, comunquese con su mdico o un Holiday representative. RECOMENDACIONES GENERALES PARA EL CUIDADO DE LA SALUD DURANTE LA LACTANCIA MATERNA  Consuma alimentos saludables. Alterne comidas y colaciones, y coma 3 de cada una por da. Dado que lo que come Danaher Corporation, es posible que algunas comidas hagan que su beb se vuelva ms irritable de lo habitual. Evite comer este tipo de alimentos si percibe que afectan de manera negativa al beb.  Beba leche, jugos de fruta y agua para Patent examiner su sed (aproximadamente 10 vasos al Futures trader).  Descanse con frecuencia, reljese y tome sus vitaminas prenatales para evitar la fatiga, el estrs y la anemia.  Contine con los autocontroles de la mama.  Evite Product manager y fumar tabaco. Las sustancias qumicas de los cigarrillos que pasan a la leche materna y la exposicin al humo ambiental del tabaco pueden daar al beb.  No consuma alcohol ni drogas, incluida la marihuana. Algunos medicamentos, que pueden ser perjudiciales para el beb, pueden pasar a travs de la Col[employer] Pal[employer]. Es importante que consulte a su mdico antes de Medical sales representative, incluidos todos los medicamentos recetados y de [place], as como los suplementos vitamnicos y  herbales. Puede quedar embarazada durante la lactancia. Si desea controlar la natalidad, consulte a su mdico cules son las opciones ms seguras para el beb. SOLICITE ATENCIN MDICA SI:   Daphane Shepherd  siente que quiere dejar de Museum/gallery exhibitions officer o se siente frustrada con la lactancia.  Siente dolor en las mamas o en los pezones.  Sus pezones estn agrietados o Water quality scientist.  Sus pechos estn irritados, sensibles o calientes.  Tiene un rea hinchada en cualquiera de las mamas.  Siente escalofros o fiebre.  Tiene nuseas o vmitos.  Presenta una secrecin de otro lquido distinto de la leche materna de los pezones.  Sus mamas no se llenan antes de Museum/gallery exhibitions officer al beb para el quinto da despus del [place].  Se siente triste y deprimida.  El beb est demasiado somnoliento como para comer bien.  El beb tiene problemas para dormir.  Moja menos de 3 paales en 24 horas.  Defeca menos de 3 veces en 24 horas.  La piel del beb o la parte blanca de los ojos se vuelven amarillentas.  El beb no ha aumentado de [place] a los [address] de [place]. SOLICITE ATENCIN MDICA DE INMEDIATO SI:   El beb est muy cansado Retail buyer) y no se quiere despertar para comer.  Le sube la fiebre sin causa.   Esta informacin no tiene Theme park manager el consejo del mdico. Asegrese de hacerle al mdico cualquier pregunta que tenga.   Document Released: 05/25/2005 Document Revised: 02/13/2015 Elsevier Interactive Patient Education Yahoo[employer] Induccin del [place] de parto  (Labor Induction) Se denomina induccin del trabajo de parto cuando se inician acciones para hacer que una mujer embarazada comience el [place] de [place]. La Harley[employer]Davidson de las mujeres comienzan el trabajo de parto sin ayuda entre las semanas 37 y 42 del Psychiatrist. Cuando esto no ocurre o cuando hay una necesidad mdica, pueden utilizarse diferentes mtodos para inducirlo. La induccin del trabajo de parto hace que el tero se contraiga. Tambin  hace que el cuello del tero se ablandemadure), se abra (se dilate), y se afine (se borre). Generalmente el trabajo de parto no se induce antes de las 39 semanas excepto que haya un problema con el beb o con la [place].  Antes de inducir el trabajo de parto, el mdico considerar cierto nmero de factores incluyendo los siguientes:  El estado del beb.  Cuntas semanas tiene de [place].  La madurez de los pulmones del beb.  El Shepherd del cuello del tero.  La posicin del beb. CULES SON LOS MOTIVOS PARA INDUCIR UN PARTO? El [place] de parto puede inducirse por las siguientes razones:  La salud del beb o de la madre estn en riesgo.  El embarazo se ha pasado de trmino en 1 semana o ms.  Ha roto la bolsa de aguas pero no se ha iniciado el trabajo de parto por s mismo.  La madre tiene algn trastorno de salud o una enfermedad grave, como hipertensin arterial, una infeccin, desprendimiento abrupto de la placenta o diabetes.  Hay escaso lquido amnitico alrededor del beb.  El beb presenta sufrimiento. La conveniencia o el deseo de que el beb nazca en una cierta fecha no es un motivo para inducir el [place]. CULES SON LOS MTODOS UTILIZADOS PARA INDUCIR EL TRABAJO DE PARTO? Algunos mtodos de induccin del Ihor Dow son:   Administracin del medicamentos prostaglandina. Este medicamento hace que el cuello uterino se dilate y [place]. Este medicamento tambin iniciar las contracciones. Puede tomarse por boca o insertarse en la vagina en forma de supositorio.  Insercin en la vagina de un tubo delgado (catter) con un baln en el extremo para dilatar el cuello del tero. Una vez insertado, el  baln se infla con agua, lo que provoca la apertura del cuello del tero.  Ruptura de las [place]. El mdico separa el saco amnitico del cuello uterino, haciendo que el cuello uterino se distienda y cause la liberacin de la hormona llamada progesterona. Esto hace que el tero  se contraiga. Este procedimiento se realiza durante una visita al consultorio mdico. Le indicarn que vuelva a su casa y espere que se inicien las contracciones. Luego tendr que volver para la induccin.  Ruptura de la bolsa de aguas. El mdico romper el saco amnitico con un pequeo instrumento. Una vez que el saco amnitico se rompe, las Therapist, occupational. Pueden pasar algunas horas hasta que [employer]oll [employer].  Medicamentos que desencadenen o intensifiquen las contracciones. Se lo administrarn a travs de un catter por va intravenosa (IV) que se inserta en una de las venas del brazo. Todos los mtodos de induccin, excepto la ruptura de [place], se Futures trader hospital. La induccin se Games developer hospital, de modo que usted y el beb puedan ser controlados cuidadosamente.  CUNTO TIEMPO LLEVA INDUCIR EL TRABAJO DE PARTO? Algunas inducciones pueden demorar entre 2 y [address]. Generalmente lleva Sara Lee, dependiendo del [place] del cuello del tero. Puede tomar ms tiempo si la induccin se realiza en etapas tempranas del Psychiatrist o es su Tree surgeon. Si han pasado 2 o [address] y no se inicia el trabajo de [place], podrn enviarla a su casa o Magazine features editor cesrea. CULES SON LOS RIESGOS ASOCIADOS CON LA INDUCCiN DEL TRABAJO DE PARTO? Algunos de los riesgos de la induccin son:   Cambios en la frecuencia cardaca fetal, por ejemplo los latidos son demasiado rpidos, o lentos, o errticos.  Riesgo de distrs fetal.  Posibilidad de infeccin en la madre o el beb.  Aumento de la posibilidad de que sea necesaria una cesrea.  Ruptura (abrupcin) de la placenta del tero (raro).  Ruptura uterina (muy raro). Cuando es Passenger transport manager la induccin por razones mdicas, los beneficios deben superar a los [place]. CULES SON ALGUNAS RAZONES PARA NO INDUCIR EL TRABAJO DE PARTO? La induccin no debe realizarse si:   Se demuestra que el beb no tolera el trabajo de  [place].  Fue sometida anteriormente a Personnel officer, como una miomectoma o le han extirpado fibromas.  La placenta est en una posicin muy baja en el tero y obstruye la abertura del cuello (placenta previa).  El beb no est ubicado con la [employer].  El cordn umbilical cae hacia el canal de parto, adelante del beb. Esto puede cortar el suministro de [place] y oxgeno al beb.  Fue sometida a Higher education careers adviser.  Hay circunstancias poco habituales, como que el beb es Doctor, general practice.   Esta informacin no tiene Theme park manager el consejo del mdico. Asegrese de hacerle al mdico cualquier pregunta que tenga.   Document Released: 09/01/2007 Document Revised: 06/15/2014 Elsevier Interactive Patient Education Yahoo! Inc.

## 2015-04-22 NOTE — Progress Notes (Signed)
Subjective:  Rebecca Holder is a 24 y.o. G2P1001 at 8821w5d being seen today for ongoing prenatal care.  Patient reports no complaints.  Contractions: Not present.  Vag. Bleeding: None. Movement: Present. Denies leaking of fluid.   The following portions of the patient's history were reviewed and updated as appropriate: allergies, current medications, past family history, past medical history, past social history, past surgical history and problem list. Problem list updated.  Objective:   Filed Vitals:   04/22/15 0901  BP: 117/75  Pulse: 72  Weight: 186 lb 12.8 oz (84.732 kg)    Fetal Status: Fetal Heart Rate (bpm): NST Fundal Height: 35 cm Movement: Present  Presentation: Vertex  General:  Alert, oriented and cooperative. Patient is in no acute distress.  Skin: Skin is warm and dry. No rash noted.   Cardiovascular: Normal heart rate noted  Respiratory: Normal respiratory effort, no problems with respiration noted  Abdomen: Soft, gravid, appropriate for gestational age. Pain/Pressure: Absent     Pelvic: Vag. Bleeding: None     Cervical exam deferred        Extremities: Normal range of motion.  Edema: None  Mental Status: Normal mood and affect. Normal behavior. Normal judgment and thought content.   Urinalysis:    prot 1+, gluc neg FBS 42-79 2 hour pp most are < 100 but occasionally up to as high as 208 based on diet. NST reviewed and reactive. Assessment and Plan:  Pregnancy: G2P1001 at 1021w5d  1. 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 Supervision of other high risk pregnancy, antepartum, third trimester Continue routine prenatal care.  Term labor symptoms and general obstetric precautions including but not limited to vaginal bleeding, contractions, leaking of fluid and fetal movement were reviewed in detail with the patient. Please refer to After Visit Summary for other counseling  recommendations.  Return in about 5 weeks (around 05/30/2015) for PP visit as scheduled.   Reva Boresanya S Tyshon Fanning, MD

## 2015-04-22 NOTE — Progress Notes (Signed)
IOL scheduled 11/16.

## 2015-04-22 NOTE — Telephone Encounter (Signed)
[id number] interpreter number

## 2015-04-23 DIAGNOSIS — O24415 Gestational diabetes mellitus in pregnancy, controlled by oral hypoglycemic drugs: Secondary | ICD-10-CM | POA: Diagnosis not present

## 2015-04-23 DIAGNOSIS — Z3A39 39 weeks gestation of pregnancy: Secondary | ICD-10-CM

## 2015-04-23 NOTE — H&P (Signed)
Rebecca Holder is a 24 y.o. female G2P1001 with IUP at 3446w0d presenting for IOL for A2/BDM, TOLAC. [facility] 21 weeks after failing an early 1hr gtt, Pt wants BTL, esp if CS  Prenatal History/Complications:  Late Care A2/BDM  CS 2008 "baby couldn't pass through pelvis"  Baby was 6#.  32.7% VBAC success predicted.  Past Medical History: Past Medical History  Diagnosis Date  . Gestational diabetes     Past Surgical History: Past Surgical History  Procedure Laterality Date  . Cesarean section      Obstetrical History: OB History    Gravida Para Term Preterm AB TAB SAB Ectopic Multiple Living   2 1 1  0 0 0 0 0 0 1       Social History: Social History   Social History  . Marital Status: Single    Spouse Name: N/A  . Number of Children: N/A  . Years of Education: N/A   Social History Main Topics  . Smoking status: Never Smoker   . Smokeless tobacco: None  . Alcohol Use: No  . Drug Use: None  . Sexual Activity: Not Currently     Comment: would like tubal ligation   Other Topics Concern  . None   Social History Narrative    Family History: History reviewed. No pertinent family history.  Allergies: No Known Allergies  Prescriptions prior to admission  Medication Sig Dispense Refill Last Dose  . aspirin EC 81 MG tablet Take 1 tablet (81 mg total) by mouth daily. 100 tablet 2 Taking  . glyBURIDE (DIABETA) 2.5 MG tablet Take three tablets by mouth at breakfast and one at bedtime 60 tablet 1 Taking  . nitrofurantoin, macrocrystal-monohydrate, (MACROBID) 100 MG capsule Take 1 capsule (100 mg total) by mouth 2 (two) times daily. 14 capsule 0 Taking  . Prenatal Vit-Fe Fumarate-FA (MULTIVITAMIN-PRENATAL) 27-0.8 MG TABS tablet Take 1 tablet by mouth daily at 12 noon. 30 each 5 Taking     Prenatal Transfer Tool  Maternal Diabetes: Yes:  Diabetes Type:  Insulin/Medication controlled Genetic Screening: Normal Maternal  Ultrasounds/Referrals: Normal Fetal Ultrasounds or other Referrals:  None Maternal Substance Abuse:  No Significant Maternal Medications:  None Significant Maternal Lab Results: Lab values include: Group B Strep negative     Review of Systems   Constitutional: Negative for fever and chills Eyes: Negative for visual disturbances Respiratory: Negative for shortness of breath, dyspnea Cardiovascular: Negative for chest pain or palpitations  Gastrointestinal: Negative for vomiting, diarrhea and constipation. Negative for abdominal pain (contractions) Genitourinary: Negative for dysuria and urgency Musculoskeletal: Negative for back pain, joint pain, myalgias  Neurological: Negative for dizziness and headaches      Blood pressure 128/73, pulse 84, temperature 98.3 F (36.8 C), temperature source Oral, resp. rate 18, last menstrual period 06/23/2014. General appearance: alert, cooperative and no distress Lungs: clear to auscultation bilaterally Heart: regular rate and rhythm Abdomen: soft, non-tender; bowel sounds normal Pelvic: FT/50/-2 Extremities: Homans sign is negative, no sign of DVT DTR's 2+ Presentation: cephalic Fetal monitoring  Baseline: 140 bpm, Variability: Good {> 6 bpm), Accelerations: Reactive and Decelerations: Absent Uterine activity  None Dilation: Fingertip Effacement (%): 50 Exam by:: F. Crezenzo_Dishmon, CNM   Prenatal labs: ABO, Rh: A/Positive/-- (06/27 0000) Antibody: Negative (06/27 0000) Rubella: !Error! RPR: NON REAC (08/22 1221)  HBsAg: Negative (06/27 0000)  HIV: NONREACTIVE (08/22 1221)  GBS: Negative (10/27 0000)  EFW: 3040 g @ 37 weeks [facility] Labs  Dating LMP?/19 week [place] Blood type: A/Positive/-- (06/27 0000)   Genetic Screen 1 Screen: Too late   AFP:   Quad: Neg     NIPS: Antibody:Negative (06/27 0000)  Anatomic [place] Normal female, but incomplete F/U complete Rubella: Immune (06/27 0000)  GTT Early:      199:  RPR:  Nonreactive (06/27 0000)   Flu vaccine  01/28/15 HBsAg: Negative (06/27 0000)   TDaP vaccine   01/28/15                                HIV:   non-reactive (01/28/15)  Baby Food  Breast                                             GBS: (For PCN allergy, check sensitivities)  Contraception BTL but no insurance Pap: normal (12/03/14)  Circumcision Declines   Pediatrician [facility]   Support Person Family members      Results for orders placed or performed during the hospital encounter of 04/24/15 (from the past 24 hour(s))  CBC   Collection Time: 04/24/15  7:10 AM  Result Value Ref Range   WBC 7.0 4.0 - 10.5 K/uL   RBC 3.80 (L) 3.87 - 5.11 MIL/uL   Hemoglobin 11.0 (L) 12.0 - 15.0 g/dL   HCT 16.1 (L) 09.6 - 04.5 %   MCV 87.9 78.0 - 100.0 fL   MCH 28.9 26.0 - 34.0 pg   MCHC 32.9 30.0 - 36.0 g/dL   RDW 40.9 81.1 - 91.4 %   Platelets 188 150 - 400 K/uL    Assessment: Rebecca Holder is a 24 y.o. G2P1001 with an IUP at [redacted]w[redacted]d presenting for IOL for A2DM, TOLAC  Plan: #Labor: Foley placed and inflated with 60cc H20 #Pain:  prn #FWB Cat 1    CRESENZO-DISHMAN,Demian Maisel 04/24/2015, 7:49 AM

## 2015-04-24 ENCOUNTER — Encounter (HOSPITAL_COMMUNITY): Payer: Self-pay

## 2015-04-24 ENCOUNTER — Inpatient Hospital Stay (HOSPITAL_COMMUNITY): Payer: Medicaid Other | Admitting: Anesthesiology

## 2015-04-24 ENCOUNTER — Inpatient Hospital Stay (HOSPITAL_COMMUNITY)
Admission: RE | Admit: 2015-04-24 | Discharge: 2015-04-26 | DRG: 775 | Disposition: A | Discharge status: Home / Self Care | Payer: Medicaid Other | Source: Ambulatory Visit | Attending: Obstetrics and Gynecology | Admitting: Obstetrics and Gynecology

## 2015-04-24 VITALS — BP 109/63 | HR 69 | Temp 98.2°F | Resp 18 | Ht 63.0 in | Wt 188.0 lb

## 2015-04-24 DIAGNOSIS — O34219 Maternal care for unspecified type scar from previous cesarean delivery: Secondary | ICD-10-CM | POA: Diagnosis present

## 2015-04-24 DIAGNOSIS — Z789 Other specified health status: Secondary | ICD-10-CM | POA: Diagnosis present

## 2015-04-24 DIAGNOSIS — O09899 Supervision of other high risk pregnancies, unspecified trimester: Secondary | ICD-10-CM

## 2015-04-24 DIAGNOSIS — Z3A39 39 weeks gestation of pregnancy: Secondary | ICD-10-CM | POA: Diagnosis not present

## 2015-04-24 DIAGNOSIS — Z8759 Personal history of other complications of pregnancy, childbirth and the puerperium: Secondary | ICD-10-CM

## 2015-04-24 DIAGNOSIS — IMO0001 Reserved for inherently not codable concepts without codable children: Secondary | ICD-10-CM | POA: Diagnosis present

## 2015-04-24 DIAGNOSIS — O09893 Supervision of other high risk pregnancies, third trimester: Secondary | ICD-10-CM

## 2015-04-24 DIAGNOSIS — O2343 Unspecified infection of urinary tract in pregnancy, third trimester: Secondary | ICD-10-CM

## 2015-04-24 DIAGNOSIS — O24913 Unspecified diabetes mellitus in pregnancy, third trimester: Secondary | ICD-10-CM | POA: Diagnosis present

## 2015-04-24 DIAGNOSIS — O24425 Gestational diabetes mellitus in childbirth, controlled by oral hypoglycemic drugs: Principal | ICD-10-CM | POA: Diagnosis present

## 2015-04-24 DIAGNOSIS — Z550 Illiteracy and low-level literacy: Secondary | ICD-10-CM

## 2015-04-24 LAB — RPR: RPR Ser Ql: NONREACTIVE

## 2015-04-24 LAB — CBC
HEMATOCRIT: 33.4 % — AB (ref 36.0–46.0)
HEMOGLOBIN: 11 g/dL — AB (ref 12.0–15.0)
MCH: 28.9 pg (ref 26.0–34.0)
MCHC: 32.9 g/dL (ref 30.0–36.0)
MCV: 87.9 fL (ref 78.0–100.0)
Platelets: 188 10*3/uL (ref 150–400)
RBC: 3.8 MIL/uL — ABNORMAL LOW (ref 3.87–5.11)
RDW: 13.5 % (ref 11.5–15.5)
WBC: 7 10*3/uL (ref 4.0–10.5)

## 2015-04-24 LAB — GLUCOSE, CAPILLARY
GLUCOSE-CAPILLARY: 104 mg/dL — AB (ref 65–99)
GLUCOSE-CAPILLARY: 93 mg/dL (ref 65–99)
Glucose-Capillary: 99 mg/dL (ref 65–99)
Glucose-Capillary: 80 mg/dL (ref 65–99)

## 2015-04-24 LAB — TYPE AND SCREEN
ABO/RH(D): A POS
ANTIBODY SCREEN: NEGATIVE

## 2015-04-24 LAB — ABO/RH: ABO/RH(D): A POS

## 2015-04-24 MED ORDER — PHENYLEPHRINE 40 MCG/ML (10ML) SYRINGE FOR IV PUSH (FOR BLOOD PRESSURE SUPPORT)
80.0000 ug | PREFILLED_SYRINGE | INTRAVENOUS | Status: DC | PRN
  Administered 2015-04-24: 80 ug via INTRAVENOUS

## 2015-04-24 MED ORDER — NITROFURANTOIN MONOHYD MACRO 100 MG PO CAPS
100.0000 mg | ORAL_CAPSULE | Freq: Two times a day (BID) | ORAL | Status: AC
  Administered 2015-04-24: 100 mg via ORAL
  Filled 2015-04-24: qty 1

## 2015-04-24 MED ORDER — PHENYLEPHRINE 40 MCG/ML (10ML) SYRINGE FOR IV PUSH (FOR BLOOD PRESSURE SUPPORT)
PREFILLED_SYRINGE | INTRAVENOUS | Status: AC
  Filled 2015-04-24: qty 20

## 2015-04-24 MED ORDER — CITRIC ACID-SODIUM CITRATE 334-500 MG/5ML PO SOLN
30.0000 mL | ORAL | Status: DC | PRN
Start: 2015-04-24 — End: 2015-04-25

## 2015-04-24 MED ORDER — OXYTOCIN 40 UNITS IN LACTATED RINGERS INFUSION - SIMPLE MED
62.5000 mL/h | INTRAVENOUS | Status: DC
  Filled 2015-04-24: qty 1000

## 2015-04-24 MED ORDER — ACETAMINOPHEN 325 MG PO TABS
650.0000 mg | ORAL_TABLET | ORAL | Status: DC | PRN
  Administered 2015-04-25: 650 mg via ORAL
  Filled 2015-04-24: qty 2

## 2015-04-24 MED ORDER — OXYCODONE-ACETAMINOPHEN 5-325 MG PO TABS: 2.0000 | ORAL_TABLET | ORAL | Status: DC | PRN

## 2015-04-24 MED ORDER — DIPHENHYDRAMINE HCL 50 MG/ML IJ SOLN: 12.5000 mg | INTRAMUSCULAR | Status: DC | PRN

## 2015-04-24 MED ORDER — OXYTOCIN 40 UNITS IN LACTATED RINGERS INFUSION - SIMPLE MED: 1.0000 m[IU]/min | INTRAVENOUS | Status: DC

## 2015-04-24 MED ORDER — TERBUTALINE SULFATE 1 MG/ML IJ SOLN: 0.2500 mg | Freq: Once | INTRAMUSCULAR | Status: DC | PRN

## 2015-04-24 MED ORDER — LACTATED RINGERS IV SOLN: 500.0000 mL | INTRAVENOUS | Status: DC | PRN

## 2015-04-24 MED ORDER — LACTATED RINGERS IV SOLN
INTRAVENOUS | Status: DC
  Administered 2015-04-24: 07:00:00 via INTRAVENOUS

## 2015-04-24 MED ORDER — FENTANYL 2.5 MCG/ML BUPIVACAINE 1/10 % EPIDURAL INFUSION (WH - ANES)
14.0000 mL/h | INTRAMUSCULAR | Status: DC | PRN
  Administered 2015-04-24 – 2015-04-25 (×3): 14 mL/h via EPIDURAL
  Filled 2015-04-24: qty 125

## 2015-04-24 MED ORDER — ONDANSETRON HCL 4 MG/2ML IJ SOLN
4.0000 mg | Freq: Four times a day (QID) | INTRAMUSCULAR | Status: DC | PRN
Start: 2015-04-24 — End: 2015-04-25

## 2015-04-24 MED ORDER — EPHEDRINE 5 MG/ML INJ: 10.0000 mg | INTRAVENOUS | Status: DC | PRN

## 2015-04-24 MED ORDER — OXYTOCIN BOLUS FROM INFUSION: 500.0000 mL | INTRAVENOUS | Status: DC

## 2015-04-24 MED ORDER — LIDOCAINE HCL (PF) 1 % IJ SOLN
30.0000 mL | INTRAMUSCULAR | Status: DC | PRN
  Filled 2015-04-24: qty 30

## 2015-04-24 MED ORDER — LIDOCAINE HCL (PF) 1 % IJ SOLN
INTRAMUSCULAR | Status: DC | PRN
  Administered 2015-04-24 (×2): 4 mL

## 2015-04-24 MED ORDER — OXYCODONE-ACETAMINOPHEN 5-325 MG PO TABS: 1.0000 | ORAL_TABLET | ORAL | Status: DC | PRN

## 2015-04-24 MED ORDER — FENTANYL 2.5 MCG/ML BUPIVACAINE 1/10 % EPIDURAL INFUSION (WH - ANES)
INTRAMUSCULAR | Status: AC
  Filled 2015-04-24: qty 125

## 2015-04-24 MED ORDER — OXYTOCIN 40 UNITS IN LACTATED RINGERS INFUSION - SIMPLE MED
1.0000 m[IU]/min | INTRAVENOUS | Status: DC
  Administered 2015-04-24: 1 m[IU]/min via INTRAVENOUS

## 2015-04-24 NOTE — Anesthesia Procedure Notes (Signed)
Epidural Patient location during procedure: OB  Staffing Anesthesiologist: Jahzion Brogden Performed by: anesthesiologist   Preanesthetic Checklist Completed: patient identified, site marked, surgical consent, pre-op evaluation, timeout performed, IV checked, risks and benefits discussed and monitors and equipment checked  Epidural Patient position: sitting Prep: site prepped and draped and DuraPrep Patient monitoring: continuous pulse ox and blood pressure Approach: midline Location: L3-L4 Injection technique: LOR saline  Needle:  Needle type: Tuohy  Needle gauge: 17 G Needle length: 9 cm and 9 Needle insertion depth: 7 cm Catheter type: closed end flexible Catheter size: 19 Gauge Catheter at skin depth: 12 cm Test dose: negative  Assessment Events: blood not aspirated, injection not painful, no injection resistance, negative IV test and no paresthesia  Additional Notes Patient identified. Risks/Benefits/Options discussed with patient including but not limited to bleeding, infection, nerve damage, paralysis, failed block, incomplete pain control, headache, blood pressure changes, nausea, vomiting, reactions to medication both or allergic, itching and postpartum back pain. Confirmed with bedside nurse the patient's most recent platelet count. Confirmed with patient that they are not currently taking any anticoagulation, have any bleeding history or any family history of bleeding disorders. Patient expressed understanding and wished to proceed. All questions were answered. Sterile technique was used throughout the entire procedure. Please see nursing notes for vital signs. Test dose was given through epidural catheter and negative prior to continuing to dose epidural or start infusion. Warning signs of high block given to the patient including shortness of breath, tingling/numbness in hands, complete motor block, or any concerning symptoms with instructions to call for help. Patient was  given instructions on fall risk and not to get out of bed. All questions and concerns addressed with instructions to call with any issues or inadequate analgesia.      

## 2015-04-24 NOTE — Progress Notes (Signed)
CSW acknowledges consult for arrival from [place] in 2013.  CSW screening out referral at this time since patient does not meet criteria for assessment.  CSW completed chart review, and noted no psychosocial stressors.    Re-consult CSW if additional needs arise or upon patient request.

## 2015-04-24 NOTE — Progress Notes (Signed)
Rebecca Holder is a 24 y.o. G2P1001 at 1223w0d admitted for induction of labor due to Gestational diabetes.  Subjective: Comfortable w/ epidural  Objective: BP 115/67 mmHg  Pulse 51  Temp(Src) 98.2 F (36.8 C) (Oral)  Resp 18  Ht 5\' 3"  (1.6 m)  Wt 85.276 kg (188 lb)  BMI 33.31 kg/m2  SpO2 98%  LMP 06/23/2014 (Approximate)      FHT:  FHR: 130s bpm, variability: moderate,  accelerations:  Present,  decelerations:  Absent- occ mi variables UC:   irregular, every 3-6 minutes w/ Pit @ 742mu/min SVE:   Dilation: 7.5 Effacement (%): 100 Station: 0 Exam by:: Philipp DeputyKim Zander Ingham CNM- AROM for clear fluid; IUPC placed  Labs: Lab Results  Component Value Date   WBC 7.0 04/24/2015   HGB 11.0* 04/24/2015   HCT 33.4* 04/24/2015   MCV 87.9 04/24/2015   PLT 188 04/24/2015    Assessment / Plan: IUP@39 .1wks TOLAC Protracted active labor, suspected due to inadequate ctx  Will increase Pit to achieve adequate labor  Cam HaiSHAW, [place] Base CNM 04/24/2015, 8:47 PM

## 2015-04-24 NOTE — Anesthesia Preprocedure Evaluation (Signed)
Anesthesia Evaluation  Patient identified by MRN, date of birth, ID band Patient awake    Reviewed: Allergy & Precautions, NPO status , Patient's Chart, lab work & pertinent test results  History of Anesthesia Complications Negative for: history of anesthetic complications  Airway Mallampati: II  TM Distance: >3 FB Neck ROM: Full    Dental no notable dental hx. (+) Dental Advisory Given   Pulmonary neg pulmonary ROS,    Pulmonary exam normal breath sounds clear to auscultation       Cardiovascular negative cardio ROS Normal cardiovascular exam Rhythm:Regular Rate:Normal     Neuro/Psych negative neurological ROS  negative psych ROS   GI/Hepatic negative GI ROS, Neg liver ROS,   Endo/Other  diabetesobesity  Renal/GU negative Renal ROS  negative genitourinary   Musculoskeletal negative musculoskeletal ROS (+)   Abdominal   Peds negative pediatric ROS (+)  Hematology negative hematology ROS (+)   Anesthesia Other Findings   Reproductive/Obstetrics (+) Pregnancy                             Anesthesia Physical Anesthesia Plan  ASA: II  Anesthesia Plan: Epidural   Post-op Pain Management:    Induction:   Airway Management Planned:   Additional Equipment:   Intra-op Plan:   Post-operative Plan:   Informed Consent: I have reviewed the patients History and Physical, chart, labs and discussed the procedure including the risks, benefits and alternatives for the proposed anesthesia with the patient or authorized representative who has indicated his/her understanding and acceptance.   Dental advisory given  Plan Discussed with: CRNA  Anesthesia Plan Comments:         Anesthesia Quick Evaluation

## 2015-04-25 ENCOUNTER — Encounter (HOSPITAL_COMMUNITY): Payer: Self-pay

## 2015-04-25 ENCOUNTER — Ambulatory Visit (HOSPITAL_COMMUNITY): Payer: Self-pay

## 2015-04-25 DIAGNOSIS — Z3A39 39 weeks gestation of pregnancy: Secondary | ICD-10-CM

## 2015-04-25 DIAGNOSIS — O24425 Gestational diabetes mellitus in childbirth, controlled by oral hypoglycemic drugs: Secondary | ICD-10-CM

## 2015-04-25 LAB — GLUCOSE, CAPILLARY
GLUCOSE-CAPILLARY: 177 mg/dL — AB (ref 65–99)
GLUCOSE-CAPILLARY: 80 mg/dL (ref 65–99)
Glucose-Capillary: 150 mg/dL — ABNORMAL HIGH (ref 65–99)
Glucose-Capillary: 107 mg/dL — ABNORMAL HIGH (ref 65–99)
Glucose-Capillary: 126 mg/dL — ABNORMAL HIGH (ref 65–99)

## 2015-04-25 LAB — CBC
HCT: 27.7 % — ABNORMAL LOW (ref 36.0–46.0)
Hemoglobin: 9.2 g/dL — ABNORMAL LOW (ref 12.0–15.0)
MCH: 29.6 pg (ref 26.0–34.0)
MCHC: 33.2 g/dL (ref 30.0–36.0)
MCV: 89.1 fL (ref 78.0–100.0)
PLATELETS: 146 10*3/uL — AB (ref 150–400)
RBC: 3.11 MIL/uL — ABNORMAL LOW (ref 3.87–5.11)
RDW: 13.7 % (ref 11.5–15.5)
WBC: 14 10*3/uL — AB (ref 4.0–10.5)

## 2015-04-25 MED ORDER — LANOLIN HYDROUS EX OINT: TOPICAL_OINTMENT | CUTANEOUS | Status: DC | PRN

## 2015-04-25 MED ORDER — SENNOSIDES-DOCUSATE SODIUM 8.6-50 MG PO TABS
2.0000 | ORAL_TABLET | ORAL | Status: DC
  Administered 2015-04-25: 2 via ORAL
  Filled 2015-04-25: qty 2

## 2015-04-25 MED ORDER — TETANUS-DIPHTH-ACELL PERTUSSIS 5-2.5-18.5 LF-MCG/0.5 IM SUSP: 0.5000 mL | Freq: Once | INTRAMUSCULAR | Status: DC

## 2015-04-25 MED ORDER — DIPHENHYDRAMINE HCL 25 MG PO CAPS: 25.0000 mg | ORAL_CAPSULE | Freq: Four times a day (QID) | ORAL | Status: DC | PRN

## 2015-04-25 MED ORDER — ONDANSETRON HCL 4 MG PO TABS: 4.0000 mg | ORAL_TABLET | ORAL | Status: DC | PRN

## 2015-04-25 MED ORDER — OXYCODONE-ACETAMINOPHEN 5-325 MG PO TABS: 1.0000 | ORAL_TABLET | ORAL | Status: DC | PRN

## 2015-04-25 MED ORDER — ZOLPIDEM TARTRATE 5 MG PO TABS
5.0000 mg | ORAL_TABLET | Freq: Every evening | ORAL | Status: DC | PRN
Start: 2015-04-25 — End: 2015-04-26

## 2015-04-25 MED ORDER — PRENATAL MULTIVITAMIN CH
1.0000 | ORAL_TABLET | Freq: Every day | ORAL | Status: DC
  Administered 2015-04-25 – 2015-04-26 (×2): 1 via ORAL
  Filled 2015-04-25 (×2): qty 1

## 2015-04-25 MED ORDER — IBUPROFEN 600 MG PO TABS
600.0000 mg | ORAL_TABLET | Freq: Four times a day (QID) | ORAL | Status: DC
  Administered 2015-04-25 – 2015-04-26 (×5): 600 mg via ORAL
  Filled 2015-04-25 (×6): qty 1

## 2015-04-25 MED ORDER — ACETAMINOPHEN 325 MG PO TABS: 650.0000 mg | ORAL_TABLET | ORAL | Status: DC | PRN

## 2015-04-25 MED ORDER — OXYCODONE-ACETAMINOPHEN 5-325 MG PO TABS: 2.0000 | ORAL_TABLET | ORAL | Status: DC | PRN

## 2015-04-25 MED ORDER — ZOLPIDEM TARTRATE 5 MG PO TABS: 5.0000 mg | ORAL_TABLET | Freq: Every evening | ORAL | Status: DC | PRN

## 2015-04-25 MED ORDER — SENNOSIDES-DOCUSATE SODIUM 8.6-50 MG PO TABS: 2.0000 | ORAL_TABLET | ORAL | Status: DC

## 2015-04-25 MED ORDER — PRENATAL MULTIVITAMIN CH: 1.0000 | ORAL_TABLET | Freq: Every day | ORAL | Status: DC

## 2015-04-25 MED ORDER — WITCH HAZEL-GLYCERIN EX PADS: 1.0000 "application " | MEDICATED_PAD | CUTANEOUS | Status: DC | PRN

## 2015-04-25 MED ORDER — ONDANSETRON HCL 4 MG/2ML IJ SOLN: 4.0000 mg | INTRAMUSCULAR | Status: DC | PRN

## 2015-04-25 MED ORDER — SIMETHICONE 80 MG PO CHEW: 80.0000 mg | CHEWABLE_TABLET | ORAL | Status: DC | PRN

## 2015-04-25 MED ORDER — BENZOCAINE-MENTHOL 20-0.5 % EX AERO
1.0000 "application " | INHALATION_SPRAY | CUTANEOUS | Status: DC | PRN
  Administered 2015-04-25: 1 via TOPICAL
  Filled 2015-04-25 (×2): qty 56

## 2015-04-25 MED ORDER — BENZOCAINE-MENTHOL 20-0.5 % EX AERO
1.0000 "application " | INHALATION_SPRAY | CUTANEOUS | Status: DC | PRN
  Filled 2015-04-25: qty 56

## 2015-04-25 MED ORDER — IBUPROFEN 600 MG PO TABS: 600.0000 mg | ORAL_TABLET | Freq: Four times a day (QID) | ORAL | Status: DC

## 2015-04-25 MED ORDER — DIBUCAINE 1 % RE OINT: 1.0000 "application " | TOPICAL_OINTMENT | RECTAL | Status: DC | PRN

## 2015-04-25 MED ORDER — DIBUCAINE 1 % RE OINT
1.0000 | TOPICAL_OINTMENT | RECTAL | Status: DC | PRN
Start: 2015-04-25 — End: 2015-04-26
  Filled 2015-04-25: qty 28

## 2015-04-25 NOTE — Progress Notes (Addendum)
Patient ID: Rebecca Holder, female   DOB: 04/05/1991, 24 y.o.   MRN: [id number]  Vaginal packing removed.  No significant bleeding
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
## 2015-04-25 NOTE — Anesthesia Postprocedure Evaluation (Signed)
  Anesthesia Post-op Note  Patient: Rebecca Holder  Procedure(s) Performed: * No procedures listed *  Patient Location: Mother/Baby  Anesthesia Type:Epidural  Level of Consciousness: awake, alert , oriented and patient cooperative  Airway and Oxygen Therapy: Patient Spontanous Breathing  Post-op Pain: none  Post-op Assessment: Post-op Vital signs reviewed, Patient's Cardiovascular Status Stable, Respiratory Function Stable, Patent Airway, No headache, No backache and Patient able to bend at knees              Post-op Vital Signs: Reviewed and stable  Last Vitals:  Filed Vitals:   04/25/15 1100  BP: 114/68  Pulse: 75  Temp: 36.8 C  Resp: 20    Complications: No apparent anesthesia complications

## 2015-04-25 NOTE — Progress Notes (Signed)
Epidural Catheter removed without difficulty. Tip intact. Site healthy. Unable to chart against in epic.

## 2015-04-25 NOTE — Progress Notes (Signed)
Patient ID: Rebecca Holder, female   DOB: 07/03/1990, 24 y.o.   MRN: [id number] Operative Delivery Note At 3:37 AM a viable and healthy female was delivered via Kiwi Vacuum assisted Vaginal delivery.  Presentation: vertex; Position: Occiput,, Anterior; Station: +3.  Verbal consent: from pt thru translator  Risks and benefits discussed in detail.  Risks include, but are not limited to the risks of anesthesia, bleeding, infection, damage to maternal tissues, fetal cephalhematoma.  There is also the risk of inability to effect vaginal delivery of the head, or shoulder dystocia that cannot be resolved by established maneuvers, leading to the need for emergency cesarean section.  APGAR: ,9,9 ; weight  .   Placenta status: schultz, intact, 3 vc, .   Cord:  with the following complications: .  Cord pH: not done Delivery in controlled slow fashion over 2 contractions. No popoffs. Anesthesia:  epidural Instruments: kiwi Episiotomy:  Tiny 1 cm median Lacerations: left vaginal sidewall x 8 cm, at 3oclock  i suspect this occurred after the first contraction, as the pt began to bleed heavily between contractions, even before the median episiotomy Suture Repair: 2.0 3.0 vicryl, with separate closure of the left vaginal sidewall laceration with 3-0 vicryl running closure with Gelpi and Sims retractor in place  Est. Blood Loss (mL):  600 Foley reinserted  After repair and vaginal pack placed x 4 hrs. Mom to postpartum.  Baby to Couplet care / Skin to Skin.  Meeghan Skipper V 04/25/2015, 4:15 AM

## 2015-04-26 DIAGNOSIS — Z8759 Personal history of other complications of pregnancy, childbirth and the puerperium: Secondary | ICD-10-CM

## 2015-04-26 DIAGNOSIS — O34219 Maternal care for unspecified type scar from previous cesarean delivery: Secondary | ICD-10-CM

## 2015-04-26 LAB — GLUCOSE, CAPILLARY
GLUCOSE-CAPILLARY: 91 mg/dL (ref 65–99)
Glucose-Capillary: 129 mg/dL — ABNORMAL HIGH (ref 65–99)

## 2015-04-26 NOTE — Lactation Note (Signed)
This note was copied from the chart of Rebecca Antonique Benitez Holder. Lactation Consultation Note  Eda Interpreter present. Baby latched in cradle position upon entering.  Rhythmical sucks and swallows observed. Mother denies soreness, problems or questions. Baby has been cluster feeding.  Suggest mother call if she needs further assistance.  Patient Name: Rebecca Holder WGNFA'OToday's Date: 04/26/2015 Reason for consult: Follow-up assessment   Maternal Data    Feeding Feeding Type: Breast Fed Length of feed: 20 min  LATCH Score/Interventions Latch: Grasps breast easily, tongue down, lips flanged, rhythmical sucking. (latched upon entering)  Audible Swallowing: A few with stimulation  Type of Nipple: Everted at rest and after stimulation  Comfort (Breast/Nipple): Soft / non-tender     Hold (Positioning): No assistance needed to correctly position infant at breast.  LATCH Score: 9  Lactation Tools Discussed/Used     Consult Status Consult Status: PRN    Dahlia ByesBerkelhammer, Raider [facility] 04/26/2015, 8:29 AM

## 2015-04-26 NOTE — Discharge Instructions (Signed)
Parto vaginal, Cuidados posteriores  °(Vaginal Delivery, Care After) °Siga estas instrucciones durante las próximas semanas. Estas indicaciones para el alta le proporcionan información general acerca de cómo deberá cuidarse después del parto. El médico también podrá darle instrucciones específicas. El tratamiento ha sido planificado según las prácticas médicas actuales, pero en algunos casos pueden ocurrir problemas. Comuníquese con el médico si tiene algún problema o tiene preguntas al volver a su casa.  °INSTRUCCIONES PARA EL CUIDADO EN EL HOGAR  °· Tome sólo medicamentos de venta libre o recetados, según las indicaciones del médico o del farmacéutico. °· No beba alcohol, especialmente si está amamantando o toma analgésicos. °· No mastique tabaco ni fume. °· No consuma drogas. °· Continúe con un adecuado cuidado perineal. El buen cuidado perineal incluye: °¨ Higienizarse de adelante hacia atrás. °¨ Mantener la zona perineal limpia. °· No use tampones ni duchas vaginales hasta que su médico la autorice. °· Dúchese, lávese el cabello y tome baños de inmersión según las indicaciones de su médico. °· Utilice un sostén que le ajuste bien y que brinde buen soporte a sus mamas. °· Consuma alimentos saludables. °· Beba suficiente líquido para mantener la orina clara o de color amarillo pálido. °· Consuma alimentos ricos en fibra como cereales y panes integrales, arroz, frijoles y frutas y verduras frescas todos los días. Estos alimentos pueden ayudarla a prevenir o aliviar el estreñimiento. °· Siga las recomendaciones de su médico relacionadas con la reanudación de actividades como subir escaleras, conducir automóviles, levantar objetos, hacer ejercicios o viajar. °· Hable con su médico acerca de reanudar la actividad sexual. Volver a la actividad sexual depende del riesgo de infección, la velocidad de la curación y la comodidad y su deseo de reanudarla. °· Trate de que alguien la ayude con las actividades del hogar y con  el recién nacido al menos durante un par de días después de salir del hospital. °· Descanse todo lo que pueda. Trate de descansar o tomar una siesta mientras el bebé está durmiendo. °· Aumente sus actividades gradualmente. °· Cumpla con todas las visitas de control programadas para después del parto. Es muy importante asistir a todas las citas programadas de seguimiento. En estas citas, su médico va a controlarla para asegurarse de que esté sanando física y emocionalmente. °SOLICITE ATENCIÓN MÉDICA SI:  °· Elimina coágulos grandes por la vagina. Guarde algunos coágulos para mostrarle al médico. °· Tiene una secreción con feo olor que proviene de la vagina. °· Tiene dificultad para orinar. °· Orina con frecuencia. °· Siente dolor al orinar. °· Nota un cambio en sus movimientos intestinales. °· Aumenta el enrojecimiento, el dolor o la hinchazón en la zona de la incisión vaginal (episiotomía) o el desgarro vaginal. °· Tiene pus que drena por la episiotomía o el desgarro vaginal. °· La episiotomía o el desgarro vaginal se abren. °· Sus mamas le duelen, están duras o enrojecidas. °· Sufre un dolor intenso de cabeza. °· Tiene visión borrosa o ve manchas. °· Se siente triste o deprimida. °· Tiene pensamientos acerca de lastimarse o dañar al recién nacido. °· Tiene preguntas acerca de su cuidado personal, el cuidado del recién nacido o acerca de los medicamentos. °· Se siente mareada o sufre un desmayo. °· Tiene una erupción. °· Tiene náuseas o vómitos. °· Usted amamantó al bebé y no ha tenido su período menstrual dentro de las 12 semanas después de dejar de amamantar. °· No amamanta al bebé y no tuvo su período menstrual en las últimas 12° semanas después del   parto. °· Tiene fiebre. °SOLICITE ATENCIÓN MÉDICA DE INMEDIATO SI:  °· Siente dolor persistente. °· Siente dolor en el pecho. °· Le falta el aire. °· Se desmaya. °· Siente dolor en la pierna. °· Siente dolor en el estómago. °· El sangrado vaginal satura dos o más  apósitos en 1 hora. °  °Esta información no tiene como fin reemplazar el consejo del médico. Asegúrese de hacerle al médico cualquier pregunta que tenga. °  °Document Released: 05/25/2005 Document Revised: 02/13/2015 °Elsevier Interactive Patient Education ©2016 [employer] Inc. ° °
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
## 2015-04-26 NOTE — Discharge Summary (Signed)
OB Discharge Summary     Patient Name: Rebecca Holder DOB: 11/15/1990 MRN: [id number]  Date of admission: 04/24/2015 Delivering MD: Tilda BurrowFERGUSON, JOHN V   Date of discharge: 04/26/2015  Admitting diagnosis: INDUCTION Intrauterine pregnancy: 4658w1d     Secondary diagnosis:  Principal Problem:   Vaginal birth after cesarean Active Problems:   Previous cesarean delivery, antepartum   Diabetes mellitus affecting pregnancy in third trimester   Illiterate   Spanish speaking patient   Supervision of other high risk pregnancy, antepartum   Labor and delivery, indication for care   Status post vacuum-assisted vaginal delivery  Additional problems: None      Discharge diagnosis: VBAC, VAVD                                                                                             Post partum procedures:none  Augmentation: Pitocin and Foley Balloon  Complications: None  Hospital course:  Induction of Labor With Vaginal Delivery   24 y.o. yo O1H0865G2P2002 at 258w1d was admitted to the hospital 04/24/2015 for induction of labor.  Indication for induction: A2 DM and TOLAC. On admission, foley bulb was placed and pitocin was started. Patient with  protracted active labor, suspected due to inadequate contractions, after about 24 hours of laboring, therefore continued to titrate up pitocin. [facility] following vaginal birth after cesarean section on 04/25/2015. Patient was found to have a laceration of the left vaginal sidewall x 8 cm, at 3 o'clockwhich was repaired 3.0 vicryl. Due to bleeding, patient was vaginal packed, packing was removed after 6 hours with no further complications.   Membrane Rupture Time/Date: 8:30 PM ,04/24/2015   Intrapartum Procedures: Episiotomy: Median [2]                                         Lacerations:  2nd degree [3];Vaginal [6]  Patient had delivery of a Viable infant.  Information for the patient's newborn:   Rebecca Holder, Rebecca Holder [[id number]][[id number]]  Delivery Method: VBAC, Vacuum Assisted (Filed from Delivery Summary)   04/25/2015  Details of delivery can be found in separate delivery note.  Patient had a routine postpartum course. Here postpartum fasting glucose was 91. We discontinued patient's glyburide.  Patient is discharged home 04/26/2015   Physical exam  Filed Vitals:   04/25/15 1030 04/25/15 1100 04/25/15 1820 04/26/15 0600  BP: 100/58 114/68 101/66 109/63  Pulse: 82 75 70 69  Temp:  98.3 F (36.8 C) 97.3 F (36.3 C) 98.2 F (36.8 C)  TempSrc:  Oral Oral   Resp: 20 20 20 18   Height:      Weight:      SpO2:       General: alert, cooperative and no distress Lochia: appropriate Uterine Fundus: firm Incision: N/A DVT Evaluation: No evidence of DVT seen on physical exam. No significant calf/ankle edema. Labs: Lab Results  Component Value Date   WBC 14.0* 04/25/2015   HGB 9.2* 04/25/2015   HCT 27.7* 04/25/2015  MCV 89.1 04/25/2015   PLT 146* 04/25/2015   No flowsheet data found.  Discharge instruction: per After Visit Summary and "Baby and Me Booklet".  After visit meds:    Medication List    STOP taking these medications        aspirin EC 81 MG tablet     glyBURIDE 2.5 MG tablet  Commonly known as:  DIABETA     nitrofurantoin (macrocrystal-monohydrate) 100 MG capsule  Commonly known as:  MACROBID      TAKE these medications        multivitamin-prenatal 27-0.8 MG Tabs tablet  Take 1 tablet by mouth daily at 12 noon.       Diet: routine diet  Activity: Advance as tolerated. Pelvic rest for 6 weeks.   Outpatient follow up:6 weeks Follow up Appt:Future Appointments Date Time Provider Department Center  05/30/2015 11:05 AM Marlis Edelson, CNM [facility]   Postpartum contraception: IUD Mirena  Newborn Data: Live born female  Birth Weight: 6 lb 9.5 oz (2990 g) APGAR: 8, 9  Baby Feeding: Breast Disposition:home with  mother   04/26/2015 Danella Maiers, MD   OB fellow attestation I have seen and examined this patient and agree with above documentation in the resident's note.   Rebecca Holder is a 24 y.o. F6O1308 s/p VAVD/VBAC.   Pain is well controlled.  Plan for birth control is IUD-mirena.  Method of Feeding: breast  PE:  BP 109/63 mmHg  Pulse 69  Temp(Src) 98.2 F (36.8 C) (Oral)  Resp 18  Ht  (1.6 m)  Wt 188 lb (85.276 kg)  BMI 33.31 kg/m2  SpO2 96%  LMP 06/23/2014 (Approximate)  Breastfeeding? Unknown Gen: well appearing Heart: reg rate Lungs: normal WOB Fundus firm Ext: soft, no pain, no edema   Recent Labs  04/24/15 0710 04/25/15 0735  HGB 11.0* 9.2*  HCT 33.4* 27.7*   Plan: discharge today - postpartum care discussed - f/u clinic in 6 weeks for postpartum visit  Federico Flake, MD 10:08 AM

## 2015-05-30 ENCOUNTER — Ambulatory Visit: Payer: Self-pay | Admitting: Family

## 2015-06-17 ENCOUNTER — Ambulatory Visit: Payer: Medicaid Other | Admitting: Obstetrics and Gynecology

## 2016-06-08 NOTE — L&D Delivery Note (Signed)
Delivery Note At 6:22 AM a viable female was delivered via Vaginal, Spontaneous Delivery (Presentation: ROA).  APGAR: 9, 9; weight pending.   Placenta status: Spontaneous, intact.  Cord: 3 vessels with the following complications: none.  Anesthesia:  lidocaine Episiotomy: None Lacerations: 2nd degree;Perineal Suture Repair: 3.0 vicryl and 2.0 monochryl Est. Blood Loss (mL): 1000  Mom to postpartum.  Baby to Couplet care / Skin to Skin.  Cleone SlimCaroline Khaleesi Gruel SNM 01/01/2017, 8:01 AM  Curtis Burnett ShengBenitez Melendez is a 26 y.o. female G3P2002 with IUP at Unknown gestational age who presented to [facility] via EMS, crowning on the stretcher. She pushed for 5 minutes and delivered a female infant who appeared to be full term. Cord clamped and cut after 30 seconds due to increased bleeding. Placenta intact and spontaneous. Bleeding brisk, postpartum hemorrhage called. IM pitocin, cytotec and methergine given, firm uterine tone noted. 2nd degree laceration repaired without difficulty.  Mom and baby stable prior to transfer to postpartum. She plans on breastfeeding and bottle feeding.

## 2016-06-26 LAB — LAB REPORT - SCANNED: Preg Test, Ur: POSITIVE

## 2016-12-30 ENCOUNTER — Encounter: Payer: Self-pay | Admitting: *Deleted

## 2017-01-01 ENCOUNTER — Inpatient Hospital Stay (HOSPITAL_COMMUNITY)
Admission: AD | Admit: 2017-01-01 | Discharge: 2017-01-03 | DRG: 774 | Disposition: A | Discharge status: Home / Self Care | Payer: Medicaid Other | Source: Ambulatory Visit | Attending: Obstetrics and Gynecology | Admitting: Obstetrics and Gynecology

## 2017-01-01 ENCOUNTER — Encounter (HOSPITAL_COMMUNITY): Payer: Self-pay

## 2017-01-01 DIAGNOSIS — Z3A00 Weeks of gestation of pregnancy not specified: Secondary | ICD-10-CM | POA: Diagnosis not present

## 2017-01-01 DIAGNOSIS — Z3493 Encounter for supervision of normal pregnancy, unspecified, third trimester: Secondary | ICD-10-CM | POA: Diagnosis present

## 2017-01-01 DIAGNOSIS — O34211 Maternal care for low transverse scar from previous cesarean delivery: Principal | ICD-10-CM | POA: Diagnosis present

## 2017-01-01 LAB — CBC
HCT: 31.6 % — ABNORMAL LOW (ref 36.0–46.0)
HEMOGLOBIN: 10.1 g/dL — AB (ref 12.0–15.0)
MCH: 26.7 pg (ref 26.0–34.0)
MCHC: 32 g/dL (ref 30.0–36.0)
MCV: 83.6 fL (ref 78.0–100.0)
Platelets: 229 10*3/uL (ref 150–400)
RBC: 3.78 MIL/uL — ABNORMAL LOW (ref 3.87–5.11)
RDW: 14.8 % (ref 11.5–15.5)
WBC: 12 10*3/uL — ABNORMAL HIGH (ref 4.0–10.5)

## 2017-01-01 LAB — TYPE AND SCREEN
ABO/RH(D): A POS
Antibody Screen: NEGATIVE

## 2017-01-01 LAB — RPR: RPR: NONREACTIVE

## 2017-01-01 LAB — RAPID HIV SCREEN (HIV 1/2 AB+AG)
HIV 1/2 Antibodies: NONREACTIVE
HIV-1 P24 ANTIGEN - HIV24: NONREACTIVE

## 2017-01-01 LAB — HEPATITIS B SURFACE ANTIGEN: Hepatitis B Surface Ag: NEGATIVE

## 2017-01-01 MED ORDER — MISOPROSTOL 200 MCG PO TABS
ORAL_TABLET | ORAL | Status: AC
  Administered 2017-01-01: 800 ug
  Filled 2017-01-01: qty 4

## 2017-01-01 MED ORDER — ACETAMINOPHEN 325 MG PO TABS
650.0000 mg | ORAL_TABLET | ORAL | Status: DC | PRN
  Administered 2017-01-01: 650 mg via ORAL
  Filled 2017-01-01: qty 2

## 2017-01-01 MED ORDER — SENNOSIDES-DOCUSATE SODIUM 8.6-50 MG PO TABS
2.0000 | ORAL_TABLET | ORAL | Status: DC
  Administered 2017-01-02: 2 via ORAL
  Filled 2017-01-01 (×2): qty 2

## 2017-01-01 MED ORDER — WITCH HAZEL-GLYCERIN EX PADS: 1.0000 "application " | MEDICATED_PAD | CUTANEOUS | Status: DC | PRN

## 2017-01-01 MED ORDER — LACTATED RINGERS IV BOLUS (SEPSIS)
1000.0000 mL | Freq: Once | INTRAVENOUS | Status: AC
  Administered 2017-01-01: 1000 mL via INTRAVENOUS

## 2017-01-01 MED ORDER — SIMETHICONE 80 MG PO CHEW: 80.0000 mg | CHEWABLE_TABLET | ORAL | Status: DC | PRN

## 2017-01-01 MED ORDER — METHYLERGONOVINE MALEATE 0.2 MG/ML IJ SOLN: 0.2000 mg | INTRAMUSCULAR | Status: DC | PRN

## 2017-01-01 MED ORDER — DIPHENHYDRAMINE HCL 25 MG PO CAPS: 25.0000 mg | ORAL_CAPSULE | Freq: Four times a day (QID) | ORAL | Status: DC | PRN

## 2017-01-01 MED ORDER — COCONUT OIL OIL: 1.0000 "application " | TOPICAL_OIL | Status: DC | PRN

## 2017-01-01 MED ORDER — OXYTOCIN 10 UNIT/ML IJ SOLN
INTRAMUSCULAR | Status: AC
  Administered 2017-01-01: 06:00:00
  Filled 2017-01-01: qty 2

## 2017-01-01 MED ORDER — PRENATAL MULTIVITAMIN CH
1.0000 | ORAL_TABLET | Freq: Every day | ORAL | Status: DC
  Administered 2017-01-02 – 2017-01-03 (×2): 1 via ORAL
  Filled 2017-01-01 (×2): qty 1

## 2017-01-01 MED ORDER — LIDOCAINE HCL (PF) 1 % IJ SOLN
INTRAMUSCULAR | Status: AC
  Administered 2017-01-01: 07:00:00
  Filled 2017-01-01: qty 30

## 2017-01-01 MED ORDER — IBUPROFEN 600 MG PO TABS
600.0000 mg | ORAL_TABLET | Freq: Four times a day (QID) | ORAL | Status: DC
  Administered 2017-01-01 – 2017-01-03 (×8): 600 mg via ORAL
  Filled 2017-01-01 (×9): qty 1

## 2017-01-01 MED ORDER — MEASLES, MUMPS & RUBELLA VAC ~~LOC~~ INJ
0.5000 mL | INJECTION | Freq: Once | SUBCUTANEOUS | Status: DC
  Filled 2017-01-01: qty 0.5

## 2017-01-01 MED ORDER — ONDANSETRON HCL 4 MG/2ML IJ SOLN: 4.0000 mg | INTRAMUSCULAR | Status: DC | PRN

## 2017-01-01 MED ORDER — OXYTOCIN 40 UNITS IN LACTATED RINGERS INFUSION - SIMPLE MED
INTRAVENOUS | Status: AC
  Administered 2017-01-01: 07:00:00
  Filled 2017-01-01: qty 1000

## 2017-01-01 MED ORDER — OXYCODONE HCL 5 MG PO TABS: 10.0000 mg | ORAL_TABLET | ORAL | Status: DC | PRN

## 2017-01-01 MED ORDER — METHYLERGONOVINE MALEATE 0.2 MG/ML IJ SOLN
INTRAMUSCULAR | Status: AC
  Administered 2017-01-01: 07:00:00
  Filled 2017-01-01: qty 1

## 2017-01-01 MED ORDER — DIBUCAINE 1 % RE OINT: 1.0000 "application " | TOPICAL_OINTMENT | RECTAL | Status: DC | PRN

## 2017-01-01 MED ORDER — BENZOCAINE-MENTHOL 20-0.5 % EX AERO
1.0000 "application " | INHALATION_SPRAY | CUTANEOUS | Status: DC | PRN
  Administered 2017-01-01: 1 via TOPICAL
  Filled 2017-01-01: qty 56

## 2017-01-01 MED ORDER — TETANUS-DIPHTH-ACELL PERTUSSIS 5-2.5-18.5 LF-MCG/0.5 IM SUSP
0.5000 mL | Freq: Once | INTRAMUSCULAR | Status: AC
  Administered 2017-01-02: 0.5 mL via INTRAMUSCULAR

## 2017-01-01 MED ORDER — METHYLERGONOVINE MALEATE 0.2 MG PO TABS: 0.2000 mg | ORAL_TABLET | ORAL | Status: DC | PRN

## 2017-01-01 MED ORDER — IBUPROFEN 600 MG PO TABS
600.0000 mg | ORAL_TABLET | Freq: Once | ORAL | Status: AC
  Administered 2017-01-01: 600 mg via ORAL
  Filled 2017-01-01: qty 1

## 2017-01-01 MED ORDER — ONDANSETRON HCL 4 MG PO TABS: 4.0000 mg | ORAL_TABLET | ORAL | Status: DC | PRN

## 2017-01-01 MED ORDER — OXYCODONE HCL 5 MG PO TABS: 5.0000 mg | ORAL_TABLET | ORAL | Status: DC | PRN

## 2017-01-01 MED ORDER — AMPICILLIN-SULBACTAM SODIUM 3 (2-1) G IJ SOLR
3.0000 g | Freq: Once | INTRAMUSCULAR | Status: AC
  Administered 2017-01-01: 3 g via INTRAVENOUS
  Filled 2017-01-01: qty 3

## 2017-01-01 MED ORDER — ZOLPIDEM TARTRATE 5 MG PO TABS: 5.0000 mg | ORAL_TABLET | Freq: Every evening | ORAL | Status: DC | PRN

## 2017-01-01 NOTE — Progress Notes (Signed)
Eta in to do admission teaching . Mom requested formula sheets given and told how to do. Patient told not to get up without help. Plan of care expplained, no circ wanted and Tdap wanted. Call bell explained and emergency button explained.

## 2017-01-01 NOTE — MAU Note (Signed)
PT ARRIVED VIA EMS AT 0620 - CROWNING ON EMS STRETCHER .   DEL VIABLE  BABY BOY  AT [id number] ON EMS STRETCHER .  NON- ENGLISH  SPEAKING

## 2017-01-01 NOTE — Lactation Note (Signed)
This note was copied from a baby's chart. Lactation Consultation Note  Patient Name: Boy Infinity Burnett ShengBenitez Melendez     Initial consult with mom and term baby, now 34 hours old. Mom has decided to formula feed. Today's Date: 01/01/2017     Maternal Data    Feeding    LATCH Score/Interventions                      Lactation Tools Discussed/Used     Consult Status      Alfred LevinsLee, Jovaun Levene Anne 01/01/2017, 3:12 PM

## 2017-01-01 NOTE — H&P (Signed)
HPI: Rebecca Holder is a 26 y.o. year old 833P2002 female at Unknown weeks gestation who presents to [facility] by EMS reporting water breaking shortly before calling EMS. Was pushing involuntarily and +3 station on arrival and delivered in the stretcher. See delivery note.  States she found out she was pregnant three months ago, but did not get any prenatal care. Unknown LMP.   Video and live interpreter used. Pt is poor historian, but per review of Epic, pt has previous C/S followed by vacuum-assisted VBAC. Also had A2 GMD w/ G2.    OB History    Gravida Para Term Preterm AB Living   3 2 2  0 0 2   SAB TAB Ectopic Multiple Live Births   0 0 0 0 2     Past Medical History:  Diagnosis Date  . Gestational diabetes    Past Surgical History:  Procedure Laterality Date  . CESAREAN SECTION     Family History: family history is not on file. Social History:  reports that she has never smoked. She does not have any smokeless tobacco history on file. She reports that she does not drink alcohol. Her drug history is not on file.  Review of Systems  Eyes: Negative for blurred vision.  Gastrointestinal: Positive for abdominal pain.  Genitourinary:       Pos LOF  Neurological: Negative for headaches.   Maternal Medical History:  Reason for admission: Rupture of membranes.       Blood pressure 135/69, pulse 65, temperature 98.1 F (36.7 C), temperature source Oral, resp. rate 18, SpO2 98 %, unknown if currently breastfeeding. Maternal Exam:  Abdomen: Fundal height is 40 cm.   Fetal presentation: vertex  Introitus: Normal vulva. Amniotic fluid character: meconium stained.     Fetal Exam Fetal Monitor Review: Mode: ultrasound.   Baseline rate: 121.      Physical Exam  Vitals reviewed. Constitutional: She is oriented to person, place, and time. She appears well-developed and well-nourished. She appears distressed.  Cardiovascular: Normal rate and regular rhythm.    Respiratory: Effort normal and breath sounds normal. No respiratory distress.  GI: Soft. There is no tenderness.  40 cm fundal height  Genitourinary: Vagina normal.  Musculoskeletal: She exhibits no edema.  Neurological: She is alert and oriented to person, place, and time. She has normal reflexes.  Skin: Skin is warm and dry.  Psychiatric: She has a normal mood and affect.    Prenatal labs: ABO, Rh: --/--/A POS (07/27 [id number]) Antibody: NEG (07/27 [id number]) Rubella:  Unknown RPR:   Unknown  HBsAg:   Unknown HIV:   Unknown GBS:   Unknown  Assessment: 1. Labor: Precip delivery 2. Fetal Wellbeing: Minimal monitoring before delivery. Nml baseline/  3. Pain Control: None 4. GBS: Unknown 5. Apparently full-ter--delivered 6. PPH  Plan:  1. Admit to BS per consult with MD 2. Routine L&D orders 3. Analgesia/anesthesia PRN  4. Methergine series 5. Unasyn for uterine exploration   Dorathy KinsmanVirginia Dontavious Emily 01/01/2017, 8:13 AM

## 2017-01-01 NOTE — Lactation Note (Signed)
This note was copied from a baby's chart. Lactation Consultation Note  Patient Name: Boy Kaidyn Burnett ShengBenitez Melendez ZOXWR'UToday's Date: 01/01/2017 Reason for consult: Initial assessment Infant is 4512 hours old & seen by Lactation for Initial Assessment. Baby weighed 8 lbs 6.2 oz at birth. [facility] Interpreter entered room. Mom reports she exclusively BF her first child for 2 years (mom reports baby would not drink formula) and BF her 2nd for 1 year & 2 months but started giving formula when baby was a few days old. Mom has started formula with this baby and plans to do both BF & bottle in case she has to be separated for work. Mom reports she has been BF first (15 mins on one breast) and then gives formula after if he cries. Discussed milk volume and encouraged mom to let baby finish a feeding instead of limiting it to 15 mins. Also encouraged mom to offer both breasts at every feeding. Mom encouraged to feed baby 8-12 times/24 hours and with feeding cues.  Mom reports she was unsure how to hand express and stated she doesn't think she has much milk. LC talked mom through technique and a few drops were noted on mom's left nipple. Discussed stomach size and milk volume. Encouraged mom to continue proper technique and to ask for help as needed. Provided mom with hand pump for occasional use- showed her how to use & clean pump. Encouraged mom to pump if she decides to give a bottle instead of BF at a feeding. Provided mom with BF booklet, BF resources, and feeding log; mom made aware of O/P services, breastfeeding support groups, community resources, and our phone # for post-discharge questions.  [facility] but was not on during pregnancy due to having problems with the new e-WIC card- plan to [facility] to see if she can help mom and encouraged mom to [facility] herself and this baby. Mom reports no  questions at this time. Encouraged mom to ask for help as needed.  Maternal Data Does the patient have breastfeeding experience prior to this delivery?: Yes  Feeding  LATCH Score/Interventions                      Lactation Tools Discussed/Used     Consult Status Consult Status: Follow-up Date: 01/02/17 Follow-up type: In-patient    Oneal GroutLaura C Levone Otten 01/01/2017, 7:51 PM

## 2017-01-02 LAB — CBC
HCT: 23.9 % — ABNORMAL LOW (ref 36.0–46.0)
Hemoglobin: 7.7 g/dL — ABNORMAL LOW (ref 12.0–15.0)
MCH: 26.8 pg (ref 26.0–34.0)
MCHC: 32.2 g/dL (ref 30.0–36.0)
MCV: 83.3 fL (ref 78.0–100.0)
PLATELETS: 199 10*3/uL (ref 150–400)
RBC: 2.87 MIL/uL — AB (ref 3.87–5.11)
RDW: 15.1 % (ref 11.5–15.5)
WBC: 11.9 10*3/uL — AB (ref 4.0–10.5)

## 2017-01-02 NOTE — Progress Notes (Signed)
Interpreter states that patient has finished the fourth grade,  and lives with her sister. She also asked if the patient is a slow learner or delayed, Rn unsure.
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
## 2017-01-02 NOTE — Lactation Note (Signed)
This note was copied from a baby's chart. Lactation Consultation Note  Patient Name: Rebecca Waymon Amatostenia Benitez Melendez JXBJY'NToday's Date: 01/02/2017 Reason for consult: Follow-up assessment  Follow-up at 32 hours of age.  Mother states that infant not feeding well.  RN in room feels that infant has good intake.  Assisted with hand expression on right breast.  Drops of colostrum easily expressed and infant latched with minimal assistance.  Mother needs assistance with positioning and hand placement to prevent infant from sliding off nipple.  Intermittent audible sucks.  Mother instructed via interpreter to continue to stimulate infant to suck and to not allow infant to slide off nipple.  Feeding plan reviewed with mother with interpreter to encourage milk supply.  Appropriate volumes reviewed.  Mother verbalizes no further questions at this time. Rebecca BowenSusan Paisley Shawnise Peterkin, RN 01/02/2017 4:06 PM   Maternal Data Has patient been taught Hand Expression?: Yes  Feeding Feeding Type: Breast Fed Length of feed: 25 min  LATCH Score/Interventions Latch: Grasps breast easily, tongue down, lips flanged, rhythmical sucking.  Audible Swallowing: A few with stimulation  Type of Nipple: Everted at rest and after stimulation  Comfort (Breast/Nipple): Soft / non-tender     Hold (Positioning): Assistance needed to correctly position infant at breast and maintain latch.  LATCH Score: 8  Lactation Tools Discussed/Used     Consult Status Consult Status: Follow-up Follow-up type: In-patient    Rebecca Holder 01/02/2017, 3:59 PM

## 2017-01-02 NOTE — Progress Notes (Signed)
POSTPARTUM PROGRESS NOTE  Post Partum Day 1  Subjective:  Rebecca Holder is a 26 y.o. now G3P3003 at unknown GA s/p NSVD/preciptous delivery.  No acute events overnight.  Pt denies problems with ambulating, voiding or po intake.  She denies nausea or vomiting.  Pain is well controlled.   Lochia Small.   Objective: Blood pressure (!) 103/51, pulse 62, temperature (!) 97.5 F (36.4 C), temperature source Oral, resp. rate 16, SpO2 100 %, unknown if currently breastfeeding.  Physical Exam:  General: alert, cooperative and no distress Lochia:normal flow Chest: no respiratory distress Heart:regular rate, distal pulses intact Abdomen: soft, nontender,  Uterine Fundus: firm, appropriately tender DVT Evaluation: No calf swelling or tenderness Extremities: no edema   Recent Labs  01/01/17 0635 01/02/17 0509  HGB 10.1* 7.7*  HCT 31.6* 23.9*    Assessment/Plan:  ASSESSMENT: Rebecca Holder is a 26 y.o. [id number] s/p SVD/preciptous delivery, PPD#1. Doing well. No PNC. Unsure about postpartum contraception.  Plan for discharge tomorrow   LOS: 1 day   Kandra NicolasJulie P DegeleMD 01/02/2017, 2:08 PM

## 2017-01-02 NOTE — Progress Notes (Signed)
CSW acknowledges consult for Edenberg score > 9. CSW met with MOB at bedside to assess. CSW provided education regarding Baby Blues vs PMADs and provided MOB with information about [facility].  CSW encouraged MOB to evaluate her mental health throughout the postpartum period with the use of the New Mom Checklist developed by Postpartum Progress and notify a medical professional if symptoms arise. CSW identifies no further need for intervention at this time or barriers to discharge.  Donyae Kilner, MSW, LCSW-A Clinical Social Worker  [facility]  Office: [phone] 

## 2017-01-03 LAB — RUBELLA SCREEN: Rubella: 2.73 index (ref >0.99)

## 2017-01-03 MED ORDER — IBUPROFEN 600 MG PO TABS: 600.0000 mg | ORAL_TABLET | Freq: Four times a day (QID) | ORAL | 0 refills | Status: DC

## 2017-01-03 NOTE — Discharge Summary (Signed)
OB Discharge Summary     Patient Name: Rebecca Holder DOB: 07/22/1990 MRN: [id number]  Date of admission: 01/01/2017 Delivering MD: Dorathy KinsmanSMITH, [place]   Date of discharge: 01/03/2017  Admitting diagnosis: CTX ROM Intrauterine pregnancy: Unknown     Secondary diagnosis:  Principal Problem:   Normal vaginal delivery Active Problems:   Precipitous delivery  Additional problems: No prenatal care     Discharge diagnosis: Term Pregnancy Delivered                                                                                                Post partum procedures:none  Augmentation: none  Complications: None  Hospital course:  Onset of Labor With Vaginal Delivery     26 y.o. yo G3P2003 at Unknown was admitted in Active Labor on 01/01/2017, and delivered precipitously in MAY. Membrane Rupture Time/Date: 5:30 AM ,01/01/2017   Intrapartum Procedures: Episiotomy: None [1]                                         Lacerations:  2nd degree [3];Perineal [11]  Patient had a delivery of a Viable infant. 01/01/2017  Information for the patient's newborn:  Sharmaine BaseBenitez Holder, Boy Anallely [[id number]][[id number]]  Delivery Method: Vaginal, Spontaneous Delivery (Filed from Delivery Summary)    Pateint had an uncomplicated postpartum course.  She is ambulating, tolerating a regular diet, passing flatus, and urinating well. Patient is discharged home in stable condition on 01/03/17.   Physical exam  Vitals:   01/01/17 2100 01/02/17 0546 01/02/17 1811 01/03/17 0603  BP: 115/65 (!) 103/51 130/87 112/79  Pulse: 67 62 90 (!) 112  Resp: 16 16 18 18   Temp: 98.6 F (37 C) (!) 97.5 F (36.4 C) 98.5 F (36.9 C) (!) 97.5 F (36.4 C)  TempSrc: Oral Oral Oral Oral  SpO2: 100%  100%    General: alert, cooperative and no distress Lochia: appropriate Uterine Fundus: firm Incision: N/A DVT Evaluation: No evidence of DVT seen on physical exam. No significant calf/ankle edema. Labs: Lab Results  Component  Value Date   WBC 11.9 (H) 01/02/2017   HGB 7.7 (L) 01/02/2017   HCT 23.9 (L) 01/02/2017   MCV 83.3 01/02/2017   PLT 199 01/02/2017   No flowsheet data found.  Discharge instruction: per After Visit Summary and "Baby and Me Booklet".  After visit meds:  Allergies as of 01/03/2017   No Known Allergies     Medication List    TAKE these medications   ibuprofen 600 MG tablet Commonly known as:  ADVIL,MOTRIN Take 1 tablet (600 mg total) by mouth every 6 (six) hours.   multivitamin-prenatal 27-0.8 MG Tabs tablet Take 1 tablet by mouth daily at 12 noon.       Diet: routine diet  Activity: Advance as tolerated. Pelvic rest for 6 weeks.   Outpatient follow up:6 weeks Follow up Appt:Future Appointments Date Time Provider Department Center  02/03/2017 8:40 AM Katrinka BlazingSmith, [place] [facility]   Follow up Visit:No Follow-up on  file.  Postpartum contraception: Depo Provera  Newborn Data: Live born female  Birth Weight: 8 lb 6.2 oz (3805 g) APGAR: 9, 9  Baby Feeding: Bottle and Breast Disposition:home with mother   01/03/2017 Frederik PearJulie P Azhane Eckart, MD

## 2017-01-03 NOTE — Discharge Instructions (Signed)
Parto vaginal, cuidados posteriores  (Vaginal Delivery, Care After)  Siga estas instrucciones durante las próximas semanas. Estas indicaciones le proporcionan información acerca de cómo deberá cuidarse después del parto. El médico también podrá darle instrucciones más específicas. El tratamiento ha sido planificado según las prácticas médicas actuales, pero en algunos casos pueden ocurrir problemas. Llame al médico si tiene problemas o preguntas.  QUÉ ESPERAR DESPUÉS DEL PARTO  Después de un parto vaginal, es frecuente tener lo siguiente:  · Hemorragia leve de la vagina.  · Dolor en el abdomen, la vagina y la zona de la piel entre la abertura vaginal y el ano (perineo).  · Calambres pélvicos.  · Fatiga.  INSTRUCCIONES PARA EL CUIDADO EN EL HOGAR  Medicamentos  · Tome los medicamentos de venta libre y los recetados solamente como se lo haya indicado el médico.  · Si le recetaron un antibiótico, tómelo como se lo haya indicado el médico. No interrumpa la administración del antibiótico hasta que lo haya terminado.  Conducir  · No conduzca ni opere maquinaria pesada mientras toma analgésicos recetados.  · No conduzca durante 24 horas si le administraron un sedante.  Estilo de vida  · No beba alcohol. Esto es de suma importancia si está amamantando o toma analgésicos.  · No consuma productos que contengan tabaco, incluidos cigarrillos, tabaco de mascar o cigarrillos electrónicos. Si necesita ayuda para dejar de fumar, consulte al médico.  Comida y bebida  · Beba al menos 8 vasos de 8 onzas (240 cc) de agua todos los días a menos que el médico le indique lo contrario. Si elige amamantar al bebé, quizá deba beber aún más cantidad de agua.  · Ingiera alimentos ricos en fibras todos los días. Estos alimentos pueden ayudarla a prevenir o aliviar el estreñimiento. Los alimentos ricos en fibras incluyen, entre otros:  ? Panes y cereales integrales.  ? Arroz integral.  ? Frijoles.  ? Frutas y verduras  frescas.  Actividad  · Reanude sus actividades normales como se lo haya indicado el médico. Pregúntele al médico qué actividades son seguras para usted.  · Descanse todo lo que pueda. Trate de descansar o tomar una siesta mientras el bebé está durmiendo.  · No levante objetos que pesen más de 10 libras (4,5 kg) hasta que el médico le diga que es seguro hacerlo.  · Hable con el médico sobre cuándo puede volver a tener relaciones sexuales. Esto puede depender de lo siguiente:  ? Riesgo de sufrir infecciones.  ? Velocidad de cicatrización.  ? Comodidad y deseo de tener relaciones sexuales.  Cuidados vaginales  · Si le realizaron una episiotomía o tuvo un desgarro vaginal, contrólese la zona todos los días para detectar signos de infección. Esté atenta a los siguientes signos:  ? Aumento del enrojecimiento, la hinchazón o el dolor.  ? Más líquido o sangre.  ? Calor.  ? Pus o mal olor.  · No use tampones ni se haga duchas vaginales hasta que el médico la autorice.  · Controle la sangre que elimina por la vagina para detectar coágulos. Pueden tener el aspecto de grumos de color rojo oscuro, marrón o negro.  Instrucciones generales  · Mantenga el perineo limpio y seco, como se lo haya indicado el médico.  · Use ropa cómoda y suelta.  · Cuando vaya al baño, siempre higienícese de adelante hacia atrás.  · Pregúntele al médico si puede ducharse o tomar baños de inmersión. Si se le realizó una episiotomía o tuvo un desgarro perineal durante el trabajo del parto o   el parto, es posible que el médico le indique que no tome baños de inmersión durante un determinado tiempo.  · Use un sostén que sujete y ajuste bien sus pechos.  · Si es posible, pídale a alguien que la ayude con las tareas del hogar y a cuidar del bebé durante al menos algunos días después de salir del hospital.  · Concurra a todas las visitas de seguimiento para usted y el bebé, como se lo haya indicado el médico. Esto es importante.  SOLICITE ATENCIÓN MÉDICA  SI:  · Tiene los siguientes síntomas:  ? Secreción vaginal que tiene mal olor.  ? Dificultad para orinar.  ? Dolor al orinar.  ? Aumento o disminución repentinos de la frecuencia con que defeca.  ? Más enrojecimiento, hinchazón o dolor alrededor de la episiotomía o del desgarro vaginal.  ? Más secreción de líquido o sangre de la episiotomía o desgarro vaginal.  ? Pus o mal olor proveniente de la episiotomía o el desgarro vaginal.  ? Fiebre.  ? Erupción cutánea.  ? Poco interés o falta de interés en actividades que solían gustarle.  ? Dudas sobre su cuidado y el del bebé.  · Siente la episiotomía o el desgarro vaginal caliente al tacto.  · La episiotomía o el desgarro vaginal se está abriendo o no parece cicatrizar.  · Siente dolor en las mamas, o están duras o enrojecidas.  · Siente tristeza o preocupación de forma inusual.  · Siente náuseas o vomita.  · Elimina coágulos grandes por la vagina. Si expulsa un coágulo sanguíneo por la vagina, guárdelo para mostrárselo a su médico. No tire la cadena sin que el médico examine el coágulo antes.  · Orina más de lo habitual.  · Se siente mareada o se desmaya.  · No ha amamantado para nada y no ha tenido un período menstrual durante 12 semanas después del parto.  · Dejó de amamantar al bebé y no ha tenido su período menstrual durante 12 semanas después de dejar de amamantar.    SOLICITE ATENCIÓN MÉDICA DE INMEDIATO SI:  · Tiene los siguientes síntomas:  ? Dolor que no desaparece o no mejora con el medicamento.  ? Dolor en el pecho.  ? Dificultad para respirar.  ? Visión borrosa o manchas en la vista.  ? Pensamientos de autolesionarse o lesionar al bebé.  · Comienza a sentir dolor en el abdomen o en una de las piernas.  · Dolor de cabeza intenso.  · Se desmaya.  · Tiene una hemorragia tan intensa de la vagina que empapa dos toallitas sanitarias en una hora.    Esta información no tiene como fin reemplazar el consejo del médico. Asegúrese de hacerle al médico cualquier  pregunta que tenga.  Document Released: 05/25/2005 Document Revised: 09/16/2015 Document Reviewed: 06/09/2015  Elsevier Interactive Patient Education © 2017 Elsevier Inc.

## 2017-01-04 ENCOUNTER — Ambulatory Visit: Payer: Self-pay

## 2017-01-04 NOTE — Lactation Note (Signed)
This note was copied from a baby's chart. Lactation Consultation Note  Patient Name: Rebecca Holder: 01/04/2017 Reason for consult: Follow-up assessment;Infant weight loss (5% weight loss, per mom recently breast fed , milk is coming in [place] Spanish interpreter present )  Baby is 4581 hours old and has been to the breast consistently.  LC reviewed and updated doc flow sheets.  Per mom baby recently breast fed 15 -2 0 mins at 1520  And voided and stool.  Mom denies any discomfort and reports many swallows when the baby is feeding and comfort .  Breast are filling bilaterally , and milk is coming in, LC mentioned to mom tonight  Breast feed  the baby with feeding cues and by 3 hours due to the elevated bilirubin. When the diaper , change if needed and wake the baby up.  If mom wakes up and her breast aren't just full,tight , firm or hard to call the RN and have her fix her ice packs and set up the pump to decrease swelling, and release down before feeding.  LC praised mom for her efforts breast feeding exclusively. Mom voiced concerns over the weight loss, it's 5% weight loss, and LC reassured mom that is normal and to continue what she is already doing.  Serum Bili increased slightly from 13.7 to 14.1 at 1413 today.  Baby will stay on double photo and stay as a baby patient [facility]   LC discussed with [facility] breast findings that moms milk is coming in and to pass on to the next shift to check with mom to make sure she is not she isn't running into problems with engorgement.    Maternal Data    Feeding Feeding Type: Breast Fed Length of feed: 45 min  LATCH Score                   Interventions Interventions: Breast feeding basics reviewed  Lactation Tools Discussed/Used     Consult Status Consult Status: Follow-up Holder: 01/04/17 Follow-up type: In-patient    [name] 01/04/2017, 4:02 PM

## 2017-01-05 ENCOUNTER — Ambulatory Visit: Payer: Self-pay

## 2017-01-05 NOTE — Lactation Note (Signed)
This note was copied from a baby's chart. Lactation Consultation Note  Patient Name: Rebecca Holder ZOXWR'UToday's Date: 01/05/2017  [facility] follow [facility].  Baby remains on phototherapy.  Mom states baby is latching and feeding well every 1-3 hours.  Breasts are fuller.  Breast massage during feeding recommended.   No questions at present.  Instructed to call out with concerns/assist prn.   Maternal Data    Feeding    LATCH Score                   Interventions    Lactation Tools Discussed/Used     Consult Status      Huston FoleyMOULDEN, Miyonna Ormiston S 01/05/2017, 10:21 AM

## 2017-01-06 ENCOUNTER — Ambulatory Visit: Payer: Self-pay

## 2017-01-06 NOTE — Lactation Note (Signed)
This note was copied from a baby's chart. Lactation Consultation Note  Patient Name: Rebecca Holder Melendez ZOXWR'UToday's Date: 01/06/2017   RN agrees that there is no need for [facility] today (LS-9 & infant gained 4 oz over 24 hrs [without formula supplementation]).   [name], [name] 01/06/2017, 10:11 PM

## 2017-01-07 ENCOUNTER — Ambulatory Visit: Payer: Self-pay

## 2017-01-07 NOTE — Lactation Note (Signed)
This note was copied from a baby's chart. Lactation Consultation Note  P3, Ex BF. [employer]a Interpreter video used for [place].  Eda not available at this time. Mother states breastfeeding is going well. Denies concerns or questions. Mother has manual pump. Mom encouraged to feed baby 8-12 times/24 hours and with feeding cues.  Reviewed engorgement care and monitoring voids/stools. Baby recently bf for 50 min.   Patient Name: Rebecca Holder WUJWJ'XToday's Date: 01/07/2017 Reason for consult: Follow-up assessment   Maternal Data    Feeding Feeding Type: Breast Fed Length of feed: 50 min  LATCH Score                   Interventions    Lactation Tools Discussed/Used     Consult Status Consult Status: Complete    Hardie PulleyBerkelhammer, Ruth Boschen 01/07/2017, 10:19 AM

## 2017-01-07 NOTE — Progress Notes (Signed)
CLINICAL SOCIAL WORK MATERNAL/CHILD NOTE  Patient Details  Name: Rebecca Holder MRN: [id number] Date of Birth: 01/01/2019  Date:  01/06/2017  Clinical Social Worker Initiating Note:  Laurey Arrow Date/ Time Initiated:  01/06/17/1620     Child's Name:  Rebecca Holder   Legal Guardian:  Mother (MOB did not share FOB's information; Per MOB, FOB will not be involved. )   Need for Interpreter:  Spanish   Date of Referral:  01/06/17     Reason for Referral:  Other (Comment) (Home not prepared for infant. )   Referral Source:  [facility]   Address:  [address] [place] [address]  Phone number:  [phone]   Household Members:  Parents, Minor Children (MOB's oldest child is Solon Palm 04/25/15)   Natural Supports (not living in the home):  Immediate Family (MOB's sister and niece will provide support to MOB and children (sister; Deatra James [phone]; Niece Doroteo Bradford [phone])   Professional Supports: Case Manager/Social Worker (referral made to the [employer])   Employment: Unemployed   Type of Work:     Education:  Other (comment) (MOB completed 3rd grade. )   Financial Resources:  Medicaid   Other Resources:  [facility]   Cultural/Religious Considerations Which May Impact Care:  Per MOB's chart, MOB is Catholic  Strengths:  Pediatrician chosen    Risk Factors/Current Problems:      Cognitive State:  Alert , Able to Concentrate , Linear Thinking , Insightful    Mood/Affect:  Calm , Tearful , Sad , Interested , Comfortable    CSW Assessment: CSW met with MOB in room 121 with hospital [place] speaking interpreter.  When CSW arrived, MOB was relaxing in recliner and infant was asleep in bassinet.  MOB was soft spoken, easy to engage and receptive to meeting with CSW.  CSW explained CSW's role and encouraged MOB to ask questions.  CSW inquired about MOB's supports and MOB reported that MOB's sister will be a support  Deatra James [phone]).  MOB stated that MOB currently resides with MOB's mother and stepfather, however, they are not supportive of MOB having another baby.  MOB stated that MOB did not receive PNC due to MOB not wanting MOB's mother to know that MOB was pregnant. MOB reported that MOB's mother is aware that MOB has had a baby and is expecting MOB and her children to continue to reside with MOB's mother.  MOB made CSW aware that FOB will not be involved and declined to provide CSW with any information pertaining to FOB. MOB became tearful and when CSW explored MOB's thoughts and feelings, MOB expressed feelings of sadness due to a lack of support from FOB and MOB's family.  CSW validated and normalized MOB's thoughts and feelings and offered MOB in-home community resources.  MOB was receptive to the referral to the [employer] (CSW made the referral). MOB reported that MOB did not have any necessary items for infant (clothing, bottles, wipes, and diapers). CSW expressed concerns and made MOB aware that CSW will need to make a report to [place]; MOB was understanding (report was made with CPS intake worker Wendall Stade). CSW assessed MOB for safety and MOB denied SI and HI.  MOB also denied being in a DV relationship.  CSW educated MOB about PPD. CSW informed MOB of possible supports and interventions to decrease PPD.  CSW also encouraged MOB to seek medical attention if needed for increased signs and symptoms for  PPD.  CSW also informed MOB of hospital's NPNC policy and MOB was understanding. CSW thanked MOB for meeting with CSW and provided MOB with CSW's contact information.    CSW Plan/Description:  Child Protective Service Report , No Further Intervention Required/No Barriers to Discharge, Patient/Family Education , Information/Referral to [employer]  (CPS will follow-up with family within 24-72 hours. )   Laurey Arrow, MSW, LCSW Clinical Social  Work [phone]  Dimple Nanas, LCSW 01/06/2017, 4:47 PM

## 2017-01-12 ENCOUNTER — Encounter: Payer: Medicaid Other | Admitting: Obstetrics & Gynecology

## 2017-02-03 ENCOUNTER — Ambulatory Visit: Payer: Medicaid Other | Admitting: Advanced Practice Midwife

## 2017-02-03 ENCOUNTER — Encounter: Payer: Self-pay | Admitting: Advanced Practice Midwife

## 2021-12-31 ENCOUNTER — Encounter: Payer: Self-pay | Admitting: *Deleted

## 2022-01-01 ENCOUNTER — Ambulatory Visit (INDEPENDENT_AMBULATORY_CARE_PROVIDER_SITE_OTHER): Payer: Self-pay

## 2022-01-01 ENCOUNTER — Other Ambulatory Visit (HOSPITAL_COMMUNITY)
Admission: RE | Admit: 2022-01-01 | Discharge: 2022-01-01 | Disposition: A | Discharge status: Home / Self Care | Payer: Self-pay | Source: Ambulatory Visit | Attending: Family Medicine | Admitting: Family Medicine

## 2022-01-01 VITALS — BP 119/78 | HR 80 | Wt 208.0 lb

## 2022-01-01 DIAGNOSIS — F419 Anxiety disorder, unspecified: Secondary | ICD-10-CM

## 2022-01-01 DIAGNOSIS — O0990 Supervision of high risk pregnancy, unspecified, unspecified trimester: Secondary | ICD-10-CM | POA: Insufficient documentation

## 2022-01-01 DIAGNOSIS — Z31430 Encounter of female for testing for genetic disease carrier status for procreative management: Secondary | ICD-10-CM

## 2022-01-01 DIAGNOSIS — O24419 Gestational diabetes mellitus in pregnancy, unspecified control: Secondary | ICD-10-CM | POA: Insufficient documentation

## 2022-01-01 NOTE — Progress Notes (Signed)
New OB Intake  Patient came in person for her New OB Intake. [facility] interpreter Eda assist with the visit.   I discussed the limitations, risks, security and privacy concerns of performing an evaluation and management service by telephone and the availability of in person appointments. I also discussed with the patient that there may be a patient responsible charge related to this service. The patient expressed understanding and agreed to proceed.  I explained I am completing New OB Intake today. We discussed her EDD of 03/17/2022 that is based on LMP of 06/10/2021. Pt is G4/P3. I reviewed her allergies, medications, Medical/Surgical/OB history, and appropriate screenings. I informed her of [facility] services. [facility] information placed in AVS. Based on history, this is a/an  pregnancy complicated by history of gestational diabetes.  .   Patient Active Problem List   Diagnosis Date Noted   Supervision of high risk pregnancy, antepartum 01/01/2022   Illiterate 12/26/2014   Spanish speaking patient 12/26/2014   Cervical polyp 12/26/2014    Concerns addressed today: -Patient OB Anatomy US is scheduled with [place] [facility] 01/12/2022. Patient is aware of time, date, and address. -Patient PHQ9 and GAD7 screening were here, offer an appointment with Asher Muir, scheduled 01/20/2022. Patient was in tears and do not want to express or talk about her depression or anxiety.  -Patient is scheduled for fasting 2 hours gtt on 01/05/2022 due to history of gestational diabetes during her 2nd pregnancy.   Delivery Plans Plans to deliver at [facility]. Patient given information for [facility] Healthy Baby website for more information about [facility]. Patient is interested in water birth. Offered upcoming OB visit with CNM to discuss further.  MyChart/Babyscripts MyChart access verified. I explained pt will have some visits in office and some virtually. Babyscripts instructions given and order placed. Patient  verifies receipt of registration text/e-mail. Account successfully created and app downloaded.  Blood Pressure Cuff/Weight Scale Patient is self-pay; explained patient will be given BP cuff at first prenatal appt. Explained after first prenatal appt pt will check weekly and document in Babyscripts.   Anatomy US Explained first scheduled [place] will be around 19 weeks. Anatomy US scheduled for 01/12/2022 at 11:15AM. Pt notified to arrive at 11:00AM at [facility].  Labs Discussed Avelina Laine genetic screening with patient. Would like both Panorama and Horizon drawn at new OB visit. Routine prenatal labs needed.  Covid Vaccine Patient has not covid vaccine.   Is patient a CenteringPregnancy candidate?  Declined Declined due to Schedule/Times    Is patient a Mom+Baby Combined Care candidate?  Not a candidate     Social Determinants of Health Food Insecurity: Patient expresses food insecurity. Food Market information given to patient; explained patient may visit at the end of first OB appointment. WIC Referral: Patient is interested in referral to [facility].  Transportation: Patient expressed transportation needs. Childcare: Discussed no children allowed at ultrasound appointments. Offered childcare services; patient declines childcare services at this time.  First visit review I reviewed new OB appt with pt. I explained she will have a provider visit that includes pap smear. Explained pt will be seen by Vonzella Nipple PA-C at first visit; encounter routed to appropriate provider. Explained that patient will be seen by pregnancy navigator following visit with provider.   Vidal Schwalbe, [place] 01/01/2022  2:49 PM

## 2022-01-02 ENCOUNTER — Other Ambulatory Visit: Payer: Self-pay

## 2022-01-02 DIAGNOSIS — O0990 Supervision of high risk pregnancy, unspecified, unspecified trimester: Secondary | ICD-10-CM

## 2022-01-02 LAB — CBC/D/PLT+RPR+RH+ABO+RUBIGG...
Antibody Screen: NEGATIVE
Basophils Absolute: 0 10*3/uL (ref 0.0–0.2)
Basos: 0 %
EOS (ABSOLUTE): 0 10*3/uL (ref 0.0–0.4)
Eos: 0 %
HCV Ab: NONREACTIVE
HIV Screen 4th Generation wRfx: NONREACTIVE
Hematocrit: 31.5 % — ABNORMAL LOW (ref 34.0–46.6)
Hemoglobin: 10.8 g/dL — ABNORMAL LOW (ref 11.1–15.9)
Hepatitis B Surface Ag: NEGATIVE
Immature Grans (Abs): 0 10*3/uL (ref 0.0–0.1)
Immature Granulocytes: 0 %
Lymphocytes Absolute: 2 10*3/uL (ref 0.7–3.1)
Lymphs: 22 %
MCH: 29.6 pg (ref 26.6–33.0)
MCHC: 34.3 g/dL (ref 31.5–35.7)
MCV: 86 fL (ref 79–97)
Monocytes Absolute: 0.4 10*3/uL (ref 0.1–0.9)
Monocytes: 5 %
Neutrophils Absolute: 6.7 10*3/uL (ref 1.4–7.0)
Neutrophils: 73 %
Platelets: 263 10*3/uL (ref 150–450)
RBC: 3.65 x10E6/uL — ABNORMAL LOW (ref 3.77–5.28)
RDW: 12.5 % (ref 11.7–15.4)
RPR Ser Ql: NONREACTIVE
Rh Factor: POSITIVE
Rubella Antibodies, IGG: 4.1 index (ref >0.99)
WBC: 9.3 10*3/uL (ref 3.4–10.8)

## 2022-01-02 LAB — HEMOGLOBIN A1C
Est. average glucose Bld gHb Est-mCnc: 137 mg/dL
Hgb A1c MFr Bld: 6.4 % — ABNORMAL HIGH (ref 4.8–5.6)

## 2022-01-02 LAB — GC/CHLAMYDIA PROBE AMP (~~LOC~~) NOT AT ARMC
Chlamydia: NEGATIVE
Comment: NEGATIVE
Comment: NORMAL
Neisseria Gonorrhea: NEGATIVE

## 2022-01-02 LAB — HCV INTERPRETATION

## 2022-01-03 LAB — URINE CULTURE, OB REFLEX

## 2022-01-03 LAB — CULTURE, OB URINE

## 2022-01-05 ENCOUNTER — Other Ambulatory Visit: Payer: Self-pay

## 2022-01-05 DIAGNOSIS — O0990 Supervision of high risk pregnancy, unspecified, unspecified trimester: Secondary | ICD-10-CM

## 2022-01-06 LAB — GLUCOSE TOLERANCE, 2 HOURS W/ 1HR
Glucose, 1 hour: 242 mg/dL — ABNORMAL HIGH (ref 70–179)
Glucose, 2 hour: 217 mg/dL — ABNORMAL HIGH (ref 70–152)
Glucose, Fasting: 108 mg/dL — ABNORMAL HIGH (ref 70–91)

## 2022-01-06 NOTE — BH Specialist Note (Signed)
Integrated Behavioral Health Initial In-Person Visit  Pt consents to 15 minutes only due to uninsured  MRN: [id number] Name: Rebecca Holder  Number of Integrated Behavioral Health Clinician visits: 1- Initial Visit  Session Start time: [phone]    Session End time: 1010  Total time in minutes: 15   Types of Service: Individual psychotherapy  Interpretor:Yes.   Interpretor Name and Language: Spanish   Warm Hand Off Completed.        Subjective: Rebecca Holder is a 31 y.o. female accompanied by  Spanish interpreter via video Patient was referred by Vonzella Nipple, PA for anxiety and depression. Patient reports the following symptoms/concerns: SI in the past, attributes to past traumas that she does not wish to discuss at this time;  denies current SI with no intent and no plan. Pt open to referral to [facility] for Outpatient ongoing therapy.   Duration of problem: Ongoing since at least 31yo, possibly longer ; Severity of problem:  moderately severe  Objective: Mood: Depressed and Affect: Constricted and Tearful Risk of harm to self or others: Suicidal ideation No plan to harm self or others  Life Context: Family and Social: Pt has three children (14yo, 6yo, 5yo) School/Work: - Self-Care: - Life Changes: Current pregnancy  Patient and/or Family's Strengths/Protective Factors: Concrete supports in place (healthy food, safe environments, etc.)  Goals Addressed: Patient will: Reduce symptoms of: anxiety, depression, and stress Increase knowledge and/or ability of: healthy habits  Demonstrate ability to: Increase healthy adjustment to current life circumstances and Increase adequate support systems for patient/family  Progress towards Goals: Ongoing  Interventions: Interventions utilized: Psychoeducation and/or Health Education, Link to [employer], and Supportive Reflection  Standardized Assessments completed: Not Needed  Patient and/or Family  Response: Patient agrees with treatment plan.   Patient Centered Plan: Patient is on the following Treatment Plan(s):  IBH  Assessment: Patient currently experiencing Adjustment disorder with mixed anxious and depressed mood.   Patient may benefit from psychoeducation and brief therapeutic interventions regarding coping with symptoms of depression, anxiety, life stress .  Plan: Follow up with behavioral health clinician on : Call Alphonso Gregson at [phone], as needed. Behavioral recommendations:  -Continue taking prenatal vitamin as prescribed; follow all medical recommendations remaining pregnancy -Continue plan to set up MyChart today -Consider reading through information on After Visit Summary; use as needed -Accept referral to [facility] outpatient therapy; use walk-in hours and/or 988, as needed, if SI returns Referral(s): Integrated Art gallery manager (In Clinic), Community Mental Health Services (LME/Outside Clinic), and Community Resources:  perinatal support group  Rae Lips, [place]     01/01/2022    1:46 PM 04/15/2015    8:57 AM  Depression screen PHQ 2/9  Decreased Interest 0 0  Down, Depressed, Hopeless 0 0  PHQ - 2 Score 0 0  Altered sleeping 3   Tired, decreased energy 3   Change in appetite 3   Feeling bad or failure about yourself  3   Trouble concentrating 1   Moving slowly or fidgety/restless 3   Suicidal thoughts 3   PHQ-9 Score 19       01/01/2022    1:47 PM  GAD 7 : Generalized Anxiety Score  Nervous, Anxious, on Edge 1  Control/stop worrying 0  Worry too much - different things 3  Trouble relaxing 3  Restless 1  Easily annoyed or irritable 3  Afraid - awful might happen 1  Total GAD 7 Score 12

## 2022-01-07 ENCOUNTER — Telehealth: Payer: Self-pay | Admitting: General Practice

## 2022-01-07 LAB — PANORAMA PRENATAL TEST FULL PANEL:PANORAMA TEST PLUS 5 ADDITIONAL MICRODELETIONS: FETAL FRACTION: 25.2

## 2022-01-07 NOTE — Telephone Encounter (Signed)
Called patient with eda assisting with spanish interpretation & informed patient of results. Scheduled appt with Rebecca Holder for 8/15 @ 815. Patient verbalized understanding.

## 2022-01-07 NOTE — Telephone Encounter (Signed)
-----   Message from Rolm Bookbinder, [place] sent at 01/06/2022 10:24 AM EDT ----- Please notify of abnormal early GTT and needs appt with diabetes educator

## 2022-01-09 LAB — HORIZON CUSTOM: REPORT SUMMARY: NEGATIVE

## 2022-01-20 ENCOUNTER — Other Ambulatory Visit: Payer: Self-pay

## 2022-01-20 ENCOUNTER — Ambulatory Visit (INDEPENDENT_AMBULATORY_CARE_PROVIDER_SITE_OTHER): Payer: Self-pay | Admitting: Registered"

## 2022-01-20 ENCOUNTER — Encounter: Payer: Self-pay | Admitting: Medical

## 2022-01-20 ENCOUNTER — Ambulatory Visit: Payer: Self-pay | Admitting: Clinical

## 2022-01-20 ENCOUNTER — Ambulatory Visit (INDEPENDENT_AMBULATORY_CARE_PROVIDER_SITE_OTHER): Payer: Self-pay | Admitting: Medical

## 2022-01-20 ENCOUNTER — Encounter: Payer: Self-pay | Attending: Medical | Admitting: Registered"

## 2022-01-20 VITALS — BP 104/72 | HR 86 | Wt 210.0 lb

## 2022-01-20 DIAGNOSIS — Z789 Other specified health status: Secondary | ICD-10-CM

## 2022-01-20 DIAGNOSIS — F4323 Adjustment disorder with mixed anxiety and depressed mood: Secondary | ICD-10-CM

## 2022-01-20 DIAGNOSIS — O24419 Gestational diabetes mellitus in pregnancy, unspecified control: Secondary | ICD-10-CM

## 2022-01-20 DIAGNOSIS — O24415 Gestational diabetes mellitus in pregnancy, controlled by oral hypoglycemic drugs: Secondary | ICD-10-CM

## 2022-01-20 DIAGNOSIS — Z713 Dietary counseling and surveillance: Secondary | ICD-10-CM | POA: Insufficient documentation

## 2022-01-20 DIAGNOSIS — Z3A32 32 weeks gestation of pregnancy: Secondary | ICD-10-CM

## 2022-01-20 MED ORDER — METFORMIN HCL 500 MG PO TABS: 500.0000 mg | ORAL_TABLET | Freq: Every day | ORAL | 1 refills | Status: DC

## 2022-01-20 NOTE — Patient Instructions (Signed)
Center for [facility] at [facility] [address] [place], [place] [address] [phone] (main office) [phone] (Eliazer Hemphill's office)  [email or web address]  Have questions or need help accessing your MyChart account? To contact the MyChart Help Desk, call [phone]/ [phone].     [facility] de mujeres  Plan posparto para la familia   Un plan de juego realista para ayudarnos a adaptarnos a la vida con un nuevo beb.     REUNIN CREATIVA Desarrolla un plan Metas:  Proporcione una forma de comenzar una conversacin sobre su nueva vida con un beb  Ayudar a los padres a Public house manager y [employer] recursos a su alcance  Ayuda a Media planner camino antes del nacimiento para un perodo de transicin ms fcil despus.  Haga una lista de la siguiente informacin para Pharmacologist en una ubicacin central:  Nombre completo de mam y compaero: ______________________  Zollie Beckers completo y fecha de nacimiento del beb: ________________ Direccion de casa: __________________________ Hortencia Conradi de casa: _________________________  Nmeros de clulas de los padres: _____________________  Zollie Beckers e informacin de contacto para OB: ____________________  Zollie Beckers e informacin de contacto del pediatra: _______________________________  Informacin de contacto para Consultores de Lactancia: ________________________  [place]ette Peter DUERME * Cada uno necesita al menos 4-5 horas de sueo ininterrumpido todos los [place]. Escriba nombres especficos e informacin de contacto. *  Cmo vas a descansar en el perodo posparto? Mientras el compaero est en casa? Cuando el compaero vuelve al Aleen Campi? Cuando ambos vuelven al Aleen Campi?  Dnde dormir tu beb?   Quin est disponible para ayudar Administrator? Noche? Noche?  Quin podra mudarse por un perodo para ayudarlo?   Cules son algunas ideas para ayudarlo a dormir lo  suficiente? ________________________________________________________________________________________________________________________________________________________________________________________________________________________________________________________________________________________________________________________  [place] Dragon * Planifique las comidas antes de que nazca su beb para que pueda comer alimentos saludables durante el perodo de posparto inmediato. *  Quin se ocupar del desayuno? Almuerzo? Cena? Lista de nombres e informacin de contacto. Haga una lluvia de ideas rpidas y saludables para cada comida.  Qu puede hacer antes de que nazca el beb para preparar las comidas para el perodo de posparto?  Cmo pueden otros ayudarte con las comidas?  Qu supermercados ofrecen compras y entregas en lnea?  Qu restaurantes ofrecen opciones de comida para llevar o para llevar? ______________________________________________________________________________________________________________________________________________________________________________________________________________________________________________________________________________________________________________________________________________________________________________________________________  CUIDADO CON LA MAM * Es importante que mam sea cuidada y [place] en el perodo de posparto. Recuerde, las formas ms importantes en que las nuevas madres necesitan atencin son: sueo, nutricin, ejercicio suave y [place]. *  Quin puede venir a cuidar a Educational psychologist perodo? Haga una lista de personas con su informacin de contacto.  Enumere algunas actividades que lo hagan sentir protegido, descansado y con Engineer, drilling. Quin puede asegurarse de tener oportunidades para hacer estas cosas?  Tiene mam un espacio propio dentro de su hogar que sea solo para ella? Haga una "cueva de mam" donde  pueda sentirse cmoda, descansar y renovarse a s misma a diario. ______________________________________________________________________________________________________________________________________________________________________________________________________________________________________________________________________________________________________________________________________________________________________________________________________  Barrie Lyme ALIMENTACIN DEL BEB * Las personas informadas y Manufacturing engineer ofrecern el mejor apoyo con respecto a la alimentacin de su beb. *  Edquese y elija la mejor opcin de alimentacin para su beb.  Haga una lista de las personas que lo guiarn, apoyarn y sern un recurso para usted a medida que cuida y [place] a su beb. (Amigos que han amamantado o estn amamantando actualmente, consultores de [place], grupos de apoyo para la  lactancia, etc.)  Considera una doula de posparto. (Estos sitios web pueden brindarle informacin: dona.org y https://shea.org/)  Busque recursos locales de Tour manager, como el grupo de apoyo a Patent examiner en Women's o [employer]. ________________________________________________________________________________________________________________________________________________________________________________________________________________________________________________________________________________________________________________________  PENDIENTES Y ERRANDS  Quin puede ayudar con una limpieza profunda antes de que nazca el beb?  Haga una lista de las personas que lo ayudarn con las tareas [employer] y las tareas [address], como lavandera, limpieza ligera, platos, baos, etc.  Quin puede hacerte algunos mandados?  Qu puedes hacer para asegurarte de tener provisiones bsicas antes de que nazca el beb?  Quin har las  compras? __________________________________________________________________________________________________________________________________________________________________________________________________________________________________________ Moises Blood * Nutrirse ... ayuda a los padres a ser ms amorosos y permite una mejor unin con sus hijos. *  Qu tipo de cosas disfrutan t y tu pareja juntos? Qu actividades te ayudan a conectarte y Chief Operating Officer tu relacin? Haga una lista de esas cosas. Haga una lista de las personas en quienes confa para que cuiden a su beb para que puedan pasar un tiempo juntos como pareja.  Qu tipos de cosas ayudan a la pareja a sentirse conectada con mam? Hacer una lista.  Qu necesidades tendr el compaero para vincularse con el beb? Otros nios? Quin los cuidar cuando entre a Printmaker y [place] est en el hospital?  Piensa cules podran ser las necesidades de tus hijos [place]. Quin puede ayudarlo a satisfacer esas necesidades? De qu manera los est ayudando a prepararse para llevar a su beb a casa? Enumere algunas estrategias especficas que tiene para Press photographer. ______________________________________________________________________________________________________________________________________________________________________________________________________________________________________________________________________________________________________________________________________________________________________________________________________  Andee Poles * Alguien que puede empatizar con las experiencias normaliza tus problemas y los hace ms llevaderos. *  Haga una lista de otros amigos, vecinos y / o compaeros de trabajo que conozca con bebs (y nios pequeos, si corresponde) con quienes puede conectarse.  Haga una lista de grupos de apoyo locales o en lnea, grupos de [place], Catering manager. en los que Actor. __________________________________________________________________________________________________________________________________________________________________________________________________________________________________________     Irven [place] DE CUIDADO DE NIOS  Investigar y planificar el cuidado de los nios si la mam regresa al [place].  Hable [employer] preocupaciones de mam sobre su transicin al [place].  Hable sobre las preocupaciones de los socios con respecto a esta transicin.  SALUD MENTAL * Su salud mental es una de las principales prioridades para una madre embarazada o posparto.   1 de cada 5 mujeres experimenta ansiedad y / o depresin desde el momento de la concepcin Regulatory affairs officer ao despus del nacimiento.  Los trastornos del estado de nimo posparto son la complicacin n.  1 del embarazo y [place], y el sufrimiento experimentado por estas madres no es necesario! Estas enfermedades son temporales y responden bien al tratamiento, que a menudo incluye autocuidado, apoyo social, terapia de conversacin y medicamentos cuando sea necesario.  Las mujeres que experimentan ansiedad y depresin a menudo dicen cosas como: "Se supone que soy feliz ... por qu me siento tan triste?", "Por qu no puedo salir de eso?", "Tengo pensamientos que [place]" yo."  No hay necesidad de avergonzarse si siente estos sntomas:  Abrumado, ansioso, enojado, triste, culpable, irritable, desesperado, agotado pero no puede dormer No estas solo. Usted no tiene Counsellor. Con ayuda, estars bien.  Dnde puedo encontrar ayuda? Profesionales mdicos como su obstetra, partera, gineclogo, Pharmacist, community, proveedor de atencin primaria, pediatra o proveedores de salud mental; Grupos de apoyo de [employer]: Feelings After Birth, Georges Mouse de apoyo para la Tour manager, [address]  and Me [employer], y [employer] clases de ejercicios.  Tienes permiso para pedir ayuda. Confirmar sus sentimientos,  validar sus experiencias, compartir / aprender estrategias de afrontamiento y obtendr apoyo y aliento a medida que se recupere. Eres importante!  IDEA GENIAL  Haga una lista de recursos locales, incluidos los recursos para mam y para Insurance risk surveyor.  Identificar grupos de apoyo.  Identifique a las personas para llamar a altas horas de la noche: incluya nombres e informacin de contacto.  Hable con su pareja sobre el estado de nimo perinatal y los trastornos de ansiedad.  Hable con Gonzella Lex, partera y doula sobre el baby blues y [place] de nimo perinatal y los trastornos de ansiedad.  Hable con su pediatra sobre el estado de nimo perinatal y los trastornos de ansiedad.

## 2022-01-20 NOTE — Progress Notes (Signed)
In-person interpreter [facility] contractor Byrd Hesselbach   Patient was seen for Abnormal GTT in pregnancy (GDM?) self-management on 01/20/2022  Start time 0816 and End time 0915   Estimated due date: 03/17/22; [redacted]w[redacted]d  Clinical: Medications: reviewed Medical History: 6 yrs ago GDM controlled with oral medication Labs: OGTT [phone], A1c 6.4%   Dietary and Lifestyle History: Pt reports she has not had wellness visits since last pregnancy and was not aware of the risk of developing T2D. Patient became very emotional but after discussing her fears seemed to have taken in the information ok. Patient was informed that the doctor may want to start her on metformin.  Pt reports hypoglycemic sxs including shakes and weakness at work and drinks about 10-16 oz of juice.  Pt states she works in [employer] 8 am - 4 pm and states her employer will not let her take breaks and gets angry if she eats during her shift. Pt states she can drink protein shakes and may be able to also eat protein bars at work.  Pt states she eats beans, rice and chicken often. Pt reports she likes vegetables but only eats them 1-2x/week. Pt reports limited dairy intake. Pt has daily fruit intake  Physical Activity: not assessed Stress: not assessed Sleep: not assessed  24 hr Recall:  First Meal: 1/2 milk & 1/2 coffee with sugar, sweet bread Snack: Second meal: pizza, juice yesterday on day off, juice at work  Snack: Third meal: mango, grapes (usually not much appetite at night) Snack: Beverages: water, milk, coffee, juice  NUTRITION INTERVENTION  Nutrition education (E-1) on the following topics:   Initial Follow-up  [x]  []  Definition of Gestational Diabetes [x]  []  Why dietary management is important in controlling blood glucose [x]  []  Effects each nutrient has on blood glucose levels [x]  []  Simple carbohydrates vs complex carbohydrates []  []  Fluid intake []  []  Creating a balanced meal plan []  []  Carbohydrate  counting  []  []  When to check blood glucose levels [x]  []  Proper blood glucose monitoring techniques []  []  Effect of stress and stress reduction techniques  []  []  Exercise effect on blood glucose levels, appropriate exercise during pregnancy []  []  Importance of limiting caffeine and abstaining from alcohol and smoking [x]  []  Medications used for blood sugar control during (metformin) pregnancy [x]  []  Hypoglycemia and rule of 15 []  []  Postpartum self care  Blood glucose monitor given: Prodigy Lot # Strips B-4 Exp: 02/01/2023 CBG: 125 mg/dL - fasting  Patient instructed to monitor glucose levels: FBS: 60 - ? 95 mg/dL (some clinics use 90 for cutoff) 1 hour: ? 140 mg/dL 2 hour: ? mg/dL  Patient received handouts: Nutrition Diabetes and Pregnancy Carbohydrate Counting List  Patient will be seen for follow-up as needed.

## 2022-01-21 NOTE — Progress Notes (Signed)
   PRENATAL VISIT NOTE  Subjective:  Rebecca Holder is a 31 y.o. G4P2003 at [redacted]w[redacted]d being seen today for diabetes management.  She is currently monitored for the following issues for this high-risk pregnancy and has Illiterate; Language barrier; Cervical polyp; Gestational diabetes mellitus (GDM), antepartum; and Supervision of high risk pregnancy, antepartum on their problem list.  Patient reports no complaints.  Contractions: Not present. Vag. Bleeding: None.  Movement: Present. Denies leaking of fluid.   The following portions of the patient's history were reviewed and updated as appropriate: allergies, current medications, past family history, past medical history, past social history, past surgical history and problem list.   Objective:   Vitals:   01/20/22 1013  BP: 104/72  Pulse: 86  Weight: 210 lb (95.3 kg)    Fetal Status: Fetal Heart Rate (bpm): 138   Movement: Present     General:  Alert, oriented and cooperative. Patient is in no acute distress.  Skin: Skin is warm and dry. No rash noted.   Cardiovascular: Normal heart rate noted  Respiratory: Normal respiratory effort, no problems with respiration noted  Abdomen: Soft, gravid, appropriate for gestational age.  Pain/Pressure: Present     Pelvic: Cervical exam deferred        Extremities: Normal range of motion.  Edema: None  Mental Status: Normal mood and affect. Normal behavior. Normal judgment and thought content.   Assessment and Plan:  Pregnancy: G4P2003 at [redacted]w[redacted]d 1. Gestational diabetes mellitus (GDM) controlled on oral hypoglycemic drug, antepartum - See note from Diabetes Education for more information - Discussed patients 2-hour GTT and FBS results with Dr. Charlotta Newton, given advanced gestation and values reviewed, it is agreed that we should start medication prior to new OB appointment in 2 weeks - metFORMIN (GLUCOPHAGE) 500 MG tablet; Take 1 tablet (500 mg total) by mouth daily with breakfast.  Dispense: 30  tablet; Refill: 1 - Referral to Nutrition and Diabetes Services  2. Language barrier - Interpreter line used   3. [redacted] weeks gestation of pregnancy  Preterm labor symptoms and general obstetric precautions including but not limited to vaginal bleeding, contractions, leaking of fluid and fetal movement were reviewed in detail with the patient. Please refer to After Visit Summary for other counseling recommendations.   Return in about 2 weeks (around 02/03/2022) for [facility] APP, In-Person.  Future Appointments  Date Time Provider Department Center  02/03/2022 10:55 AM Rebecca Holder [facility]    Rebecca Nipple, PA-C

## 2022-02-03 ENCOUNTER — Encounter: Payer: Self-pay | Admitting: Medical

## 2022-02-03 ENCOUNTER — Ambulatory Visit (INDEPENDENT_AMBULATORY_CARE_PROVIDER_SITE_OTHER): Payer: Self-pay | Admitting: Medical

## 2022-02-03 VITALS — BP 115/64 | HR 74 | Wt 207.0 lb

## 2022-02-03 DIAGNOSIS — Z3A36 36 weeks gestation of pregnancy: Secondary | ICD-10-CM

## 2022-02-03 DIAGNOSIS — O24415 Gestational diabetes mellitus in pregnancy, controlled by oral hypoglycemic drugs: Secondary | ICD-10-CM

## 2022-02-03 DIAGNOSIS — Z758 Other problems related to medical facilities and other health care: Secondary | ICD-10-CM

## 2022-02-03 DIAGNOSIS — O26899 Other specified pregnancy related conditions, unspecified trimester: Secondary | ICD-10-CM

## 2022-02-03 DIAGNOSIS — Z3A34 34 weeks gestation of pregnancy: Secondary | ICD-10-CM

## 2022-02-03 DIAGNOSIS — O0990 Supervision of high risk pregnancy, unspecified, unspecified trimester: Secondary | ICD-10-CM

## 2022-02-03 DIAGNOSIS — R102 Pelvic and perineal pain: Secondary | ICD-10-CM

## 2022-02-03 DIAGNOSIS — O34219 Maternal care for unspecified type scar from previous cesarean delivery: Secondary | ICD-10-CM

## 2022-02-03 DIAGNOSIS — O26893 Other specified pregnancy related conditions, third trimester: Secondary | ICD-10-CM

## 2022-02-03 DIAGNOSIS — O0993 Supervision of high risk pregnancy, unspecified, third trimester: Secondary | ICD-10-CM

## 2022-02-03 DIAGNOSIS — Z789 Other specified health status: Secondary | ICD-10-CM

## 2022-02-03 MED ORDER — METFORMIN HCL 500 MG PO TABS: 500.0000 mg | ORAL_TABLET | Freq: Two times a day (BID) | ORAL | 1 refills | Status: DC

## 2022-02-03 NOTE — Addendum Note (Signed)
Addended by: Hulda Marin C on: 02/03/2022 04:15 PM   Modules accepted: Orders

## 2022-02-05 NOTE — Progress Notes (Signed)
   PRENATAL VISIT NOTE  Subjective:  Rebecca Holder is a 31 y.o. G4P2003 at [redacted]w[redacted]d being seen today for her first prenatal visit for this pregnancy.  She is currently monitored for the following issues for this high-risk pregnancy and has Illiterate; Language barrier; Cervical polyp; Gestational diabetes mellitus (GDM), antepartum; Supervision of high risk pregnancy, antepartum; and Previous cesarean section complicating pregnancy on their problem list.  Patient reports backache and round ligament pain.  Contractions: Not present. Vag. Bleeding: None.  Movement: Present. Denies leaking of fluid.   She is planning to breastfeed. Desires BTL for contraception.   The following portions of the patient's history were reviewed and updated as appropriate: allergies, current medications, past family history, past medical history, past social history, past surgical history and problem list.   Objective:   Vitals:   02/03/22 1022  BP: 115/64  Pulse: 74  Weight: 207 lb (93.9 kg)    Fetal Status: Fetal Heart Rate (bpm): 140   Movement: Present     General:  Alert, oriented and cooperative. Patient is in no acute distress.  Skin: Skin is warm and dry. No rash noted.   Cardiovascular: Normal heart rate and rhythm noted  Respiratory: Normal respiratory effort, no problems with respiration noted. Clear to auscultation.   Abdomen: Soft, gravid, appropriate for gestational age. Normal bowel sounds. Non-tender. Pain/Pressure: Present     Pelvic: Cervical exam deferred        Extremities: Normal range of motion.  Edema: None  Mental Status: Normal mood and affect. Normal behavior. Normal judgment and thought content.    Assessment and Plan:  Pregnancy: G4P2003 at [redacted]w[redacted]d 1. Supervision of high risk pregnancy, antepartum  2. Gestational diabetes mellitus (GDM) controlled on oral hypoglycemic drug, antepartum - [place] reviewed from [place] with limited detail due to late gestation - [place] MFM OB  DETAIL +14 WK; Future - metFORMIN (GLUCOPHAGE) 500 MG tablet; Take 1 tablet (500 mg total) by mouth 2 (two) times daily with a meal.  Dispense: 60 tablet; Refill: 1 - Reviewed CBGs and most are elevated, although mildly - Discussed patient with Dr. Adrian Blackwater who recommend starting insulin if patient is agreeable as this will provide better control quicker than increasing PO - Discussed this recommendation with the patient including the risks and benefits of both options, patient declines to start insulin today and is agreeable to increasing Metformin   3. Language barrier - Interpreter present   4. Previous cesarean section complicating pregnancy - Patient desires repeat with BTL, message sent to schedule   6. Pain of round ligament affecting pregnancy, antepartum - Maternity belt given today   7. [redacted] weeks gestation of pregnancy  Preterm labor/first trimester warning symptoms and general obstetric precautions including but not limited to vaginal bleeding, contractions, leaking of fluid and fetal movement were reviewed in detail with the patient. Please refer to After Visit Summary for other counseling recommendations.   Discussed the normal visit cadence for prenatal care Discussed the nature of our practice with multiple providers including residents and students   Return in about 1 week (around 02/10/2022) for [facility] MD only, In-Person.  Future Appointments  Date Time Provider Department Center  02/10/2022 11:15 AM Mora Appl [facility]  02/17/2022  7:30 AM [facility] NURSE [facility]  02/17/2022  7:45 AM [facility] US6 [facility]    Vonzella Nipple, PA-C

## 2022-02-10 ENCOUNTER — Ambulatory Visit (INDEPENDENT_AMBULATORY_CARE_PROVIDER_SITE_OTHER): Payer: Self-pay | Admitting: Advanced Practice Midwife

## 2022-02-10 ENCOUNTER — Other Ambulatory Visit: Payer: Self-pay

## 2022-02-10 VITALS — BP 112/66 | HR 81 | Wt 207.6 lb

## 2022-02-10 DIAGNOSIS — O24419 Gestational diabetes mellitus in pregnancy, unspecified control: Secondary | ICD-10-CM

## 2022-02-10 DIAGNOSIS — O0990 Supervision of high risk pregnancy, unspecified, unspecified trimester: Secondary | ICD-10-CM

## 2022-02-10 DIAGNOSIS — Z789 Other specified health status: Secondary | ICD-10-CM

## 2022-02-10 DIAGNOSIS — O34219 Maternal care for unspecified type scar from previous cesarean delivery: Secondary | ICD-10-CM

## 2022-02-10 NOTE — Progress Notes (Signed)
Pt has Glucose Log today. 

## 2022-02-10 NOTE — Patient Instructions (Addendum)
Increase you Metformin to 1 pill (500 mg) in the morning and 2 pills (1000 mg) at night.

## 2022-02-10 NOTE — Progress Notes (Signed)
   PRENATAL VISIT NOTE  Subjective:  Rebecca Holder is a 31 y.o. G4P2003 at [redacted]w[redacted]d being seen today for ongoing prenatal care.  She is currently monitored for the following issues for this high-risk pregnancy and has Illiterate; Language barrier; Cervical polyp; Gestational diabetes mellitus (GDM), antepartum; Supervision of high risk pregnancy, antepartum; and Previous cesarean section complicating pregnancy on their problem list.  Patient reports no complaints.  Contractions: Not present. Vag. Bleeding: None.  Movement: Present. Denies leaking of fluid.   Spanish interpreter used.   Increased Metformin last week to 500 BID on 02/01/22 Fasting CBGs: 86-111 (50% out of range) PCB:  93-123 (10% out of range) PCL: not checking either lunch or dinner--not clear. PCD: 102-129 (20% out of range)   The following portions of the patient's history were reviewed and updated as appropriate: allergies, current medications, past family history, past medical history, past social history, past surgical history and problem list.   Objective:   Vitals:   02/10/22 1153  BP: 112/66  Pulse: 81  Weight: 207 lb 9.6 oz (94.2 kg)    Fetal Status: Fetal Heart Rate (bpm): 149   Movement: Present     General:  Alert, oriented and cooperative. Patient is in no acute distress.  Skin: Skin is warm and dry. No rash noted.   Cardiovascular: Normal heart rate noted  Respiratory: Normal respiratory effort, no problems with respiration noted  Abdomen: Soft, gravid, appropriate for gestational age.  Pain/Pressure: Absent     Pelvic: Cervical exam deferred        Extremities: Normal range of motion.  Edema: None  Mental Status: Normal mood and affect. Normal behavior. Normal judgment and thought content.   Assessment and Plan:  Pregnancy: G4P2003 at [redacted]w[redacted]d  1. Gestational diabetes mellitus (GDM) controlled on oral hypoglycemic drug, antepartum - [place] sill elevated. Increased Metformin to 500 in am  and 1000 QHS - Growth [place] 9/12    2. Supervision of high risk pregnancy, antepartum - RCS w/ BTL scheduled 03/10/22  3. Previous cesarean section complicating pregnancy - RCS w/ BTL scheduled 03/10/22  4. Language barrier Video interpreter used  Preterm labor symptoms and general obstetric precautions including but not limited to vaginal bleeding, contractions, leaking of fluid and fetal movement were reviewed in detail with the patient. Please refer to After Visit Summary for other counseling recommendations.   No follow-ups on file.  Future Appointments  Date Time Provider Department Center  02/17/2022  7:30 AM [facility] NURSE [facility]  02/17/2022  7:45 AM [facility] US6 [facility]    Dorathy Kinsman, CNM

## 2022-02-17 ENCOUNTER — Ambulatory Visit: Payer: Self-pay | Admitting: *Deleted

## 2022-02-17 ENCOUNTER — Other Ambulatory Visit: Payer: Self-pay | Admitting: Medical

## 2022-02-17 ENCOUNTER — Other Ambulatory Visit (HOSPITAL_COMMUNITY)
Admission: RE | Admit: 2022-02-17 | Discharge: 2022-02-17 | Disposition: A | Discharge status: Home / Self Care | Payer: Self-pay | Source: Ambulatory Visit | Attending: Family Medicine | Admitting: Family Medicine

## 2022-02-17 ENCOUNTER — Encounter: Payer: Self-pay | Admitting: *Deleted

## 2022-02-17 ENCOUNTER — Encounter: Payer: Self-pay | Admitting: Family Medicine

## 2022-02-17 ENCOUNTER — Ambulatory Visit: Payer: Self-pay | Attending: Medical

## 2022-02-17 ENCOUNTER — Ambulatory Visit (INDEPENDENT_AMBULATORY_CARE_PROVIDER_SITE_OTHER): Payer: Self-pay | Admitting: Family Medicine

## 2022-02-17 VITALS — BP 114/77 | HR 74 | Wt 207.2 lb

## 2022-02-17 VITALS — BP 104/64 | HR 85

## 2022-02-17 DIAGNOSIS — O24419 Gestational diabetes mellitus in pregnancy, unspecified control: Secondary | ICD-10-CM

## 2022-02-17 DIAGNOSIS — O0990 Supervision of high risk pregnancy, unspecified, unspecified trimester: Secondary | ICD-10-CM | POA: Insufficient documentation

## 2022-02-17 DIAGNOSIS — Z789 Other specified health status: Secondary | ICD-10-CM

## 2022-02-17 DIAGNOSIS — O34219 Maternal care for unspecified type scar from previous cesarean delivery: Secondary | ICD-10-CM

## 2022-02-17 DIAGNOSIS — Z3A36 36 weeks gestation of pregnancy: Secondary | ICD-10-CM

## 2022-02-17 DIAGNOSIS — O24415 Gestational diabetes mellitus in pregnancy, controlled by oral hypoglycemic drugs: Secondary | ICD-10-CM

## 2022-02-17 NOTE — Progress Notes (Signed)
   Subjective:  Rebecca Holder is a 31 y.o. G4P2003 at [redacted]w[redacted]d being seen today for ongoing prenatal care.  She is currently monitored for the following issues for this high-risk pregnancy and has Illiterate; Language barrier; Cervical polyp; White classification A2 gestational diabetes mellitus; Supervision of high risk pregnancy, antepartum; and Previous cesarean section complicating pregnancy on their problem list.  Patient reports no complaints.  Contractions: Not present. Vag. Bleeding: None.  Movement: Present. Denies leaking of fluid.   The following portions of the patient's history were reviewed and updated as appropriate: allergies, current medications, past family history, past medical history, past social history, past surgical history and problem list. Problem list updated.  Objective:   Vitals:   02/17/22 0900  BP: 114/77  Pulse: 74  Weight: 207 lb 3.2 oz (94 kg)    Fetal Status: Fetal Heart Rate (bpm): 140   Movement: Present     General:  Alert, oriented and cooperative. Patient is in no acute distress.  Skin: Skin is warm and dry. No rash noted.   Cardiovascular: Normal heart rate noted  Respiratory: Normal respiratory effort, no problems with respiration noted  Abdomen: Soft, gravid, appropriate for gestational age. Pain/Pressure: Present     Pelvic: Vag. Bleeding: None     Cervical exam deferred        Extremities: Normal range of motion.  Edema: Trace  Mental Status: Normal mood and affect. Normal behavior. Normal judgment and thought content.   Urinalysis:      Assessment and Plan:  Pregnancy: G4P2003 at [redacted]w[redacted]d  1. Supervision of high risk pregnancy, antepartum BP and FHR normal Swabs today - GC/Chlamydia probe amp ([place])not at [facility] - Culture, beta strep (group b only)  2. Previous cesarean section complicating pregnancy X3, scheduled for RCS+BTL on 03/10/2022 Would like salpingectomy  3. Language barrier Spanish  4. White  classification A2 gestational diabetes mellitus Sugars well controlled on log with metformin 500/1000 mg MFM [place] today showed normal anatomy and macrosomia with EFW 98% Will continue to follow for weekly testing until delivery  Preterm labor symptoms and general obstetric precautions including but not limited to vaginal bleeding, contractions, leaking of fluid and fetal movement were reviewed in detail with the patient. Please refer to After Visit Summary for other counseling recommendations.  Return in 1 week (on 02/24/2022) for [facility], ob visit.   Venora Maples, MD

## 2022-02-17 NOTE — Patient Instructions (Signed)
Eleccin del mtodo anticonceptivo Contraception Choices La anticoncepcin, o los mtodos anticonceptivos, hace referencia a los mtodos o dispositivos que evitan el embarazo. Mtodos hormonales  Implante anticonceptivo Un implante anticonceptivo consiste en un tubo delgado de plstico que contiene una hormona que evita el embarazo. Es diferente de un dispositivo intrauterino (DIU). Un mdico lo inserta en la parte superior del brazo. Los implantes pueden ser eficaces durante un mximo de 3 aos. Inyecciones de progestina sola Las inyecciones de progestina sola contienen progestina, una forma sinttica de la hormona progesterona. Un mdico las administra cada 3 meses. Pldoras anticonceptivas Las pldoras anticonceptivas son pastillas que contienen hormonas que evitan el embarazo. Deben tomarse una vez al da, preferentemente a la misma hora cada da. Se necesita una receta para utilizar este mtodo anticonceptivo. Parche anticonceptivo El parche anticonceptivo contiene hormonas que evitan el embarazo. Se coloca en la piel, debe cambiarse una vez a la semana durante tres semanas y debe retirarse en la cuarta semana. Se necesita una receta para utilizar este mtodo anticonceptivo. Anillo vaginal Un anillo vaginal contiene hormonas que evitan el embarazo. Se coloca en la vagina durante tres semanas y se retira en la cuarta semana. Luego se repite el proceso con un anillo nuevo. Se necesita una receta para utilizar este mtodo anticonceptivo. Anticonceptivo de emergencia Los anticonceptivos de emergencia son mtodos para evitar un embarazo despus de tener sexo sin proteccin. Vienen en forma de pldora y pueden tomarse hasta 5 das despus de tener sexo. Funcionan mejor cuando se toman lo ms pronto posible luego de tener sexo. La mayora de los anticonceptivos de emergencia estn disponibles sin receta mdica. Este mtodo no debe utilizarse como el nico mtodo anticonceptivo. Mtodos de  barrera  Condn masculino Un condn masculino es una vaina delgada que se coloca sobre el pene durante el sexo. Los condones evitan que el esperma ingrese en el cuerpo de la mujer. Pueden utilizarse con un una sustancia que mata a los espermatozoides (espermicida) para aumentar la efectividad. Deben desecharse despus de un uso. Condn femenino Un condn femenino es una vaina blanda y holgada que se coloca en la vagina antes de tener sexo. El condn evita que el esperma ingrese en el cuerpo de la mujer. Deben desecharse despus de un uso. Diafragma Un diafragma es una barrera blanda con forma de cpula. Se inserta en la vagina antes del sexo, junto con un espermicida. El diafragma bloquea el ingreso de esperma en el tero, y el espermicida mata a los espermatozoides. El diafragma debe permanecer en la vagina durante 6 a 8 horas despus de tener sexo y debe retirarse en el plazo de las 24 horas. Un diafragma es recetado y colocado por un mdico. Debe reemplazarse cada 1 a 2 aos, despus de dar a luz, de aumentar ms de 15lb (6.8kg) y de una ciruga plvica. Capuchn cervical Un capuchn cervical es una copa redonda y blanda de ltex o plstico que se coloca en el cuello uterino. Se inserta en la vagina antes del sexo, junto con un espermicida. Bloquea el ingreso del esperma en el tero. El capuchn debe permanecer en el lugar durante 6 a 8 horas despus de tener sexo y debe retirarse en el plazo de las 48 horas. Un capuchn cervical debe ser recetado y colocado por un mdico. Debe reemplazarse cada 2aos. Esponja Una esponja es una pieza blanda y circular de espuma de poliuretano que contiene espermicida. La esponja ayuda a bloquear el ingreso de esperma en el tero, y el espermicida   mata a los espermatozoides. Para utilizarla, debe humedecerla e insertarla en la vagina. Debe insertarse antes de tener sexo, debe permanecer dentro al menos durante 6 horas despus de tener sexo y debe retirarse y  desecharse en el plazo de las 30 horas. Espermicidas Los espermicidas son sustancias qumicas que matan o bloquean al esperma y no lo dejan ingresar al cuello uterino y al tero. Vienen en forma de crema, gel, supositorio, espuma o comprimido. Un espermicida debe insertarse en la vagina con un aplicador al menos 10 o 15 minutos antes de tener sexo para dar tiempo a que surta efecto. El proceso debe repetirse cada vez que tenga sexo. Los espermicidas no requieren receta mdica. Anticonceptivos intrauterinos Dispositivo intrauterino (DIU) Un DIU es un dispositivo en forma de T que se coloca en el tero. Existen dos tipos: DIU hormonal.Este tipo contiene progestina, una forma sinttica de la hormona progesterona. Este tipo puede permanecer colocado durante 3 a 5 aos. DIU de cobre.Este tipo est recubierto con un alambre de cobre. Puede permanecer colocado durante 10 aos. Mtodos anticonceptivos permanentes Ligadura de trompas en la mujer En este mtodo, se sellan, atan u obstruyen las trompas de Falopio durante una ciruga para evitar que el vulo descienda hacia el tero. Esterilizacin histeroscpica En este mtodo, se coloca un implante pequeo y flexible dentro de cada trompa de Falopio. Los implantes hacen que se forme un tejido cicatricial en las trompas de Falopio y que las obstruya para que el espermatozoide no pueda llegar al vulo. El procedimiento demora alrededor de 3 meses para que sea efectivo. Debe utilizarse otro mtodo anticonceptivo durante esos 3 meses. Esterilizacin masculina Este es un procedimiento que consiste en atar los conductos que transportan el esperma (vasectoma). Luego del procedimiento, el hombre puede eyacular lquido (semen). Debe utilizarse otro mtodo anticonceptivo durante 3 meses despus del procedimiento. Mtodos de planificacin natural Planificacin familiar natural En este mtodo, la pareja no tiene sexo durante los das en que la mujer podra quedar  embarazada. Mtodo calendario En este mtodo, la mujer realiza un seguimiento de la duracin de cada ciclo menstrual, identifica los das en los que se puede producir un embarazo y no tiene sexo durante esos das. Mtodo de la ovulacin En este mtodo, la pareja evita tener sexo durante la ovulacin. Mtodo sintotrmico Este mtodo implica no tener sexo durante la ovulacin. Normalmente, la mujer comprueba la ovulacin al observar cambios en su temperatura y en la consistencia del moco cervical. Mtodo posovulacin En este mtodo, la pareja espera a que finalice la ovulacin para tener sexo. Dnde buscar ms informacin Centers for Disease Control and Prevention (Centros para el Control y la Prevencin de Enfermedades): www.cdc.gov Resumen La anticoncepcin, o los mtodos anticonceptivos, hace referencia a los mtodos o dispositivos que evitan el embarazo. Los mtodos anticonceptivos hormonales incluyen implantes, inyecciones, pastillas, parches, anillos vaginales y anticonceptivos de emergencia. Los mtodos anticonceptivos de barrera pueden incluir condones masculinos, condones femeninos, diafragmas, capuchones cervicales, esponjas y espermicidas. Existen dos tipos de DIU (dispositivo intrauterino). Un DIU puede colocarse en el tero de una mujer para evitar el embarazo durante 3 a 5 aos. La esterilizacin permanente puede realizarse mediante un procedimiento tanto en los hombres como en las mujeres. Los mtodos de planificacin familiar natural implican no tener sexo durante los das en que la mujer podra quedar embarazada. Esta informacin no tiene como fin reemplazar el consejo del mdico. Asegrese de hacerle al mdico cualquier pregunta que tenga. Document Revised: 12/26/2019 Document Reviewed: 12/26/2019 Elsevier Patient Education    2023 Elsevier Inc.  

## 2022-02-18 LAB — GC/CHLAMYDIA PROBE AMP (~~LOC~~) NOT AT ARMC
Chlamydia: NEGATIVE
Comment: NEGATIVE
Comment: NORMAL
Neisseria Gonorrhea: NEGATIVE

## 2022-02-21 LAB — CULTURE, BETA STREP (GROUP B ONLY): Strep Gp B Culture: NEGATIVE

## 2022-02-24 ENCOUNTER — Encounter (HOSPITAL_COMMUNITY): Payer: Self-pay

## 2022-02-24 ENCOUNTER — Other Ambulatory Visit: Payer: Self-pay | Admitting: Family Medicine

## 2022-02-24 ENCOUNTER — Encounter: Payer: Self-pay | Admitting: Family Medicine

## 2022-02-24 DIAGNOSIS — O34219 Maternal care for unspecified type scar from previous cesarean delivery: Secondary | ICD-10-CM

## 2022-02-24 NOTE — Pre-Procedure Instructions (Signed)
Interpreter number [phone]

## 2022-02-24 NOTE — Patient Instructions (Addendum)
Instrucciones Para Antes de la Ciruga   Su ciruga est programada para 03/10/2022  (your procedure is scheduled on) Entre por la entrada [facility]  a las [place] -(enter through the main entrance at [facility] at [place], [place] [id number] para informarnos de su llegada. (pick up phone, dial [phone] on arrival)     Por favor llame al [phone] si tiene algn problema en la maana de la ciruga (please call this number if you have any problems the morning of surgery.)                  Recuerde: (Remember)  No coma alimentos. (Do not eat food (After Midnight) Desps de medianoche)    No tome lquidos claros. (Do not drink clear liquids (4 Hours before arrival) 4 horas ante llegada) 0645   No use joyas, maquillaje de ojos, lpiz labial, crema para el cuerpo o esmalte de uas oscuro. (Do not wear jewelry, eye makeup, lipstick, body lotion, or dark fingernail polish). Puede usar desodorante (you may wear deodorant)    No se afeite 48 horas de su ciruga. (Do not shave 48 hours before your surgery)    No traiga objetos de valor al hospital.  [facility] no se hace responsable de ninguna pertenencia, ni objetos de valor que haya trado al hospital. (Do not bring valuable to the hospital.  [facility] is not responsible for any belongings or valuables brought to the hospital)   [facility] medicinas en la maana de la ciruga con un SORBITO de agua nada (take these meds the morning of surgery with a SIP of water)     Durante la ciruga no se pueden usar lentes de contacto, dentaduras o puentes. (Contacts, dentures or bridgework cannot be worn in surgery).   Si va a ser ingresado despus de la ciruga, deje la [employer] en el carro hasta que se le haya asignado una habitacin. (If you are to be admitted after surgery, leave suitcase in car until your room has been assigned.)   A los pacientes que se  les d de alta el mismo da no se les permitir manejar a casa.  (Patients discharged on the day of surgery will not be allowed to drive home)    [place] n[place] de telfono del [employer], television/film set. (Name and telephone number of your driver)   Instrucciones especiales Shower using CHG 2 nights before surgery and the night before surgery.  If you shower the day of surgery use CHG.  Use special wash - you have one bottle of CHG for all showers.  You should use approximately 1/3 of the bottle for each shower. (Special Instructions)   Por favor, lea las hojas informativas que le entregaron. (Please read over the following fact sheets that you were given) Surgical Site Infection Prevention

## 2022-02-25 ENCOUNTER — Ambulatory Visit (INDEPENDENT_AMBULATORY_CARE_PROVIDER_SITE_OTHER): Payer: Self-pay

## 2022-02-25 ENCOUNTER — Ambulatory Visit (INDEPENDENT_AMBULATORY_CARE_PROVIDER_SITE_OTHER): Payer: Self-pay | Admitting: Family Medicine

## 2022-02-25 ENCOUNTER — Other Ambulatory Visit: Payer: Self-pay

## 2022-02-25 VITALS — BP 97/62 | HR 95 | Wt 206.7 lb

## 2022-02-25 DIAGNOSIS — O24415 Gestational diabetes mellitus in pregnancy, controlled by oral hypoglycemic drugs: Secondary | ICD-10-CM

## 2022-02-25 DIAGNOSIS — O0990 Supervision of high risk pregnancy, unspecified, unspecified trimester: Secondary | ICD-10-CM

## 2022-02-25 DIAGNOSIS — O0993 Supervision of high risk pregnancy, unspecified, third trimester: Secondary | ICD-10-CM

## 2022-02-25 DIAGNOSIS — Z3A37 37 weeks gestation of pregnancy: Secondary | ICD-10-CM

## 2022-02-25 DIAGNOSIS — O34219 Maternal care for unspecified type scar from previous cesarean delivery: Secondary | ICD-10-CM

## 2022-02-25 NOTE — Progress Notes (Unsigned)
   PRENATAL VISIT NOTE  Subjective:  Rebecca Holder is a 31 y.o. G4P2003 at [redacted]w[redacted]d being seen today for ongoing prenatal care.  She is currently monitored for the following issues for this high-risk pregnancy and has Illiterate; Language barrier; Cervical polyp; White classification A2 gestational diabetes mellitus; Supervision of high risk pregnancy, antepartum; and Previous cesarean section complicating pregnancy on their problem list.  Patient reports no complaints.  Contractions: Irregular.  .  Movement: Present. Denies leaking of fluid.   The following portions of the patient's history were reviewed and updated as appropriate: allergies, current medications, past family history, past medical history, past social history, past surgical history and problem list.   Objective:   Vitals:   02/25/22 0933  BP: 97/62  Pulse: 95  Weight: 206 lb 11.2 oz (93.8 kg)    Fetal Status:     Movement: Present     General:  Alert, oriented and cooperative. Patient is in no acute distress.  Skin: Skin is warm and dry. No rash noted.   Cardiovascular: Normal heart rate noted  Respiratory: Normal respiratory effort, no problems with respiration noted  Abdomen: Soft, gravid, appropriate for gestational age.  Pain/Pressure: Present     Pelvic: Cervical exam deferred        Extremities: Normal range of motion.     Mental Status: Normal mood and affect. Normal behavior. Normal judgment and thought content.   Assessment and Plan:  Pregnancy: G4P2003 at [redacted]w[redacted]d  1. Gestational diabetes mellitus (GDM) controlled on oral hypoglycemic drug, antepartum Fastings < 95 per patient, all under 120 for pp. Patient did not bring log  Last [place] at [facility] 3527g, 98th% which is consistent with not well controlled GDM - US FETAL BPP W/NONSTRESS; Future  2. Supervision of high risk pregnancy, antepartum Up to date Continue care until RCS Plans on breastfeeding Late entry to [facility]  3. Previous cesarean section  complicating pregnancy RCS at 39 weeks, scheduled for 10/3- confirmed with patient. Knows about lab appt prior. Desires BTS at that time. Patient had CSx1 and then 2 successful VBAC in the past-- this is my first time meeting patient and she was added on due to missing previous prenatal appt. I did not see the message on the schedule to discuss her payment for the tubal until after the patient left.   Term labor symptoms and general obstetric precautions including but not limited to vaginal bleeding, contractions, leaking of fluid and fetal movement were reviewed in detail with the patient. Please refer to After Visit Summary for other counseling recommendations.   Return in about 6 days (around 03/03/2022) for as scheduled.  Future Appointments  Date Time Provider [place]  03/03/2022  8:15 AM Clarnce Flock, MD [facility]  03/04/2022  9:15 AM [facility] NST [facility]  03/09/2022  8:35 AM Caren Macadam, MD [facility]  03/09/2022 10:00 AM [facility] PAT 1 [facility] None    Caren Macadam, MD

## 2022-02-25 NOTE — Progress Notes (Unsigned)

## 2022-03-03 ENCOUNTER — Other Ambulatory Visit: Payer: Self-pay

## 2022-03-03 ENCOUNTER — Encounter: Payer: Self-pay | Admitting: Family Medicine

## 2022-03-03 ENCOUNTER — Ambulatory Visit (INDEPENDENT_AMBULATORY_CARE_PROVIDER_SITE_OTHER): Payer: Self-pay | Admitting: Family Medicine

## 2022-03-03 VITALS — BP 109/74 | HR 71 | Wt 204.1 lb

## 2022-03-03 DIAGNOSIS — Z3A38 38 weeks gestation of pregnancy: Secondary | ICD-10-CM

## 2022-03-03 DIAGNOSIS — O34219 Maternal care for unspecified type scar from previous cesarean delivery: Secondary | ICD-10-CM

## 2022-03-03 DIAGNOSIS — Z789 Other specified health status: Secondary | ICD-10-CM

## 2022-03-03 DIAGNOSIS — O24419 Gestational diabetes mellitus in pregnancy, unspecified control: Secondary | ICD-10-CM

## 2022-03-03 DIAGNOSIS — O0993 Supervision of high risk pregnancy, unspecified, third trimester: Secondary | ICD-10-CM

## 2022-03-03 DIAGNOSIS — O0990 Supervision of high risk pregnancy, unspecified, unspecified trimester: Secondary | ICD-10-CM

## 2022-03-03 NOTE — Patient Instructions (Signed)
Eleccin del mtodo anticonceptivo Contraception Choices La anticoncepcin, o los mtodos anticonceptivos, hace referencia a los mtodos o dispositivos que evitan el embarazo. Mtodos hormonales  Implante anticonceptivo Un implante anticonceptivo consiste en un tubo delgado de plstico que contiene una hormona que evita el embarazo. Es diferente de un dispositivo intrauterino (DIU). Un mdico lo inserta en la parte superior del brazo. Los implantes pueden ser eficaces durante un mximo de 3 aos. Inyecciones de progestina sola Las inyecciones de progestina sola contienen progestina, una forma sinttica de la hormona progesterona. Un mdico las administra cada 3 meses. Pldoras anticonceptivas Las pldoras anticonceptivas son pastillas que contienen hormonas que evitan el embarazo. Deben tomarse una vez al da, preferentemente a la misma hora cada da. Se necesita una receta para utilizar este mtodo anticonceptivo. Parche anticonceptivo El parche anticonceptivo contiene hormonas que evitan el embarazo. Se coloca en la piel, debe cambiarse una vez a la semana durante tres semanas y debe retirarse en la cuarta semana. Se necesita una receta para utilizar este mtodo anticonceptivo. Anillo vaginal Un anillo vaginal contiene hormonas que evitan el embarazo. Se coloca en la vagina durante tres semanas y se retira en la cuarta semana. Luego se repite el proceso con un anillo nuevo. Se necesita una receta para utilizar este mtodo anticonceptivo. Anticonceptivo de emergencia Los anticonceptivos de emergencia son mtodos para evitar un embarazo despus de tener sexo sin proteccin. Vienen en forma de pldora y pueden tomarse hasta 5 das despus de tener sexo. Funcionan mejor cuando se toman lo ms pronto posible luego de tener sexo. La mayora de los anticonceptivos de emergencia estn disponibles sin receta mdica. Este mtodo no debe utilizarse como el nico mtodo anticonceptivo. Mtodos de  barrera  Condn masculino Un condn masculino es una vaina delgada que se coloca sobre el pene durante el sexo. Los condones evitan que el esperma ingrese en el cuerpo de la mujer. Pueden utilizarse con un una sustancia que mata a los espermatozoides (espermicida) para aumentar la efectividad. Deben desecharse despus de un uso. Condn femenino Un condn femenino es una vaina blanda y holgada que se coloca en la vagina antes de tener sexo. El condn evita que el esperma ingrese en el cuerpo de la mujer. Deben desecharse despus de un uso. Diafragma Un diafragma es una barrera blanda con forma de cpula. Se inserta en la vagina antes del sexo, junto con un espermicida. El diafragma bloquea el ingreso de esperma en el tero, y el espermicida mata a los espermatozoides. El diafragma debe permanecer en la vagina durante 6 a 8 horas despus de tener sexo y debe retirarse en el plazo de las 24 horas. Un diafragma es recetado y colocado por un mdico. Debe reemplazarse cada 1 a 2 aos, despus de dar a luz, de aumentar ms de 15lb (6.8kg) y de una ciruga plvica. Capuchn cervical Un capuchn cervical es una copa redonda y blanda de ltex o plstico que se coloca en el cuello uterino. Se inserta en la vagina antes del sexo, junto con un espermicida. Bloquea el ingreso del esperma en el tero. El capuchn debe permanecer en el lugar durante 6 a 8 horas despus de tener sexo y debe retirarse en el plazo de las 48 horas. Un capuchn cervical debe ser recetado y colocado por un mdico. Debe reemplazarse cada 2aos. Esponja Una esponja es una pieza blanda y circular de espuma de poliuretano que contiene espermicida. La esponja ayuda a bloquear el ingreso de esperma en el tero, y el espermicida   mata a los espermatozoides. Para utilizarla, debe humedecerla e insertarla en la vagina. Debe insertarse antes de tener sexo, debe permanecer dentro al menos durante 6 horas despus de tener sexo y debe retirarse y  desecharse en el plazo de las 30 horas. Espermicidas Los espermicidas son sustancias qumicas que matan o bloquean al esperma y no lo dejan ingresar al cuello uterino y al tero. Vienen en forma de crema, gel, supositorio, espuma o comprimido. Un espermicida debe insertarse en la vagina con un aplicador al menos 10 o 15 minutos antes de tener sexo para dar tiempo a que surta efecto. El proceso debe repetirse cada vez que tenga sexo. Los espermicidas no requieren receta mdica. Anticonceptivos intrauterinos Dispositivo intrauterino (DIU) Un DIU es un dispositivo en forma de T que se coloca en el tero. Existen dos tipos: DIU hormonal.Este tipo contiene progestina, una forma sinttica de la hormona progesterona. Este tipo puede permanecer colocado durante 3 a 5 aos. DIU de cobre.Este tipo est recubierto con un alambre de cobre. Puede permanecer colocado durante 10 aos. Mtodos anticonceptivos permanentes Ligadura de trompas en la mujer En este mtodo, se sellan, atan u obstruyen las trompas de Falopio durante una ciruga para evitar que el vulo descienda hacia el tero. Esterilizacin histeroscpica En este mtodo, se coloca un implante pequeo y flexible dentro de cada trompa de Falopio. Los implantes hacen que se forme un tejido cicatricial en las trompas de Falopio y que las obstruya para que el espermatozoide no pueda llegar al vulo. El procedimiento demora alrededor de 3 meses para que sea efectivo. Debe utilizarse otro mtodo anticonceptivo durante esos 3 meses. Esterilizacin masculina Este es un procedimiento que consiste en atar los conductos que transportan el esperma (vasectoma). Luego del procedimiento, el hombre puede eyacular lquido (semen). Debe utilizarse otro mtodo anticonceptivo durante 3 meses despus del procedimiento. Mtodos de planificacin natural Planificacin familiar natural En este mtodo, la pareja no tiene sexo durante los das en que la mujer podra quedar  embarazada. Mtodo calendario En este mtodo, la mujer realiza un seguimiento de la duracin de cada ciclo menstrual, identifica los das en los que se puede producir un embarazo y no tiene sexo durante esos das. Mtodo de la ovulacin En este mtodo, la pareja evita tener sexo durante la ovulacin. Mtodo sintotrmico Este mtodo implica no tener sexo durante la ovulacin. Normalmente, la mujer comprueba la ovulacin al observar cambios en su temperatura y en la consistencia del moco cervical. Mtodo posovulacin En este mtodo, la pareja espera a que finalice la ovulacin para tener sexo. Dnde buscar ms informacin Centers for Disease Control and Prevention (Centros para el Control y la Prevencin de Enfermedades): www.cdc.gov Resumen La anticoncepcin, o los mtodos anticonceptivos, hace referencia a los mtodos o dispositivos que evitan el embarazo. Los mtodos anticonceptivos hormonales incluyen implantes, inyecciones, pastillas, parches, anillos vaginales y anticonceptivos de emergencia. Los mtodos anticonceptivos de barrera pueden incluir condones masculinos, condones femeninos, diafragmas, capuchones cervicales, esponjas y espermicidas. Existen dos tipos de DIU (dispositivo intrauterino). Un DIU puede colocarse en el tero de una mujer para evitar el embarazo durante 3 a 5 aos. La esterilizacin permanente puede realizarse mediante un procedimiento tanto en los hombres como en las mujeres. Los mtodos de planificacin familiar natural implican no tener sexo durante los das en que la mujer podra quedar embarazada. Esta informacin no tiene como fin reemplazar el consejo del mdico. Asegrese de hacerle al mdico cualquier pregunta que tenga. Document Revised: 12/26/2019 Document Reviewed: 12/26/2019 Elsevier Patient Education    2023 Elsevier Inc.  

## 2022-03-03 NOTE — Progress Notes (Signed)
   Subjective:  Rebecca Holder is a 31 y.o. G4P2003 at [redacted]w[redacted]d being seen today for ongoing prenatal care.  She is currently monitored for the following issues for this high-risk pregnancy and has Illiterate; Language barrier; Cervical polyp; White classification A2 gestational diabetes mellitus; Supervision of high risk pregnancy, antepartum; and Previous cesarean section complicating pregnancy on their problem list.  Patient reports no complaints.  Contractions: Irregular. Vag. Bleeding: None.  Movement: Present. Denies leaking of fluid.   The following portions of the patient's history were reviewed and updated as appropriate: allergies, current medications, past family history, past medical history, past social history, past surgical history and problem list. Problem list updated.  Objective:   Vitals:   03/03/22 0821  BP: 109/74  Pulse: 71  Weight: 204 lb 1.6 oz (92.6 kg)    Fetal Status: Fetal Heart Rate (bpm): 140   Movement: Present     General:  Alert, oriented and cooperative. Patient is in no acute distress.  Skin: Skin is warm and dry. No rash noted.   Cardiovascular: Normal heart rate noted  Respiratory: Normal respiratory effort, no problems with respiration noted  Abdomen: Soft, gravid, appropriate for gestational age. Pain/Pressure: Present     Pelvic: Vag. Bleeding: None     Cervical exam deferred        Extremities: Normal range of motion.  Edema: None  Mental Status: Normal mood and affect. Normal behavior. Normal judgment and thought content.   Urinalysis:      Assessment and Plan:  Pregnancy: G4P2003 at [redacted]w[redacted]d  1. Supervision of high risk pregnancy, antepartum BP and FHR normal  2. White classification A2 gestational diabetes mellitus Well controlled on 500/1000 of metformin Antenatal testing reassuring to date EFW 98% on most recent growth [place] Scheduled for cesarean in one week  3. Language barrier Spanish  4. Previous cesarean section  complicating pregnancy Scheduled for RCS+BTL in one week Discussed payment for tubal, patient reports she has already payed  Term labor symptoms and general obstetric precautions including but not limited to vaginal bleeding, contractions, leaking of fluid and fetal movement were reviewed in detail with the patient. Please refer to After Visit Summary for other counseling recommendations.  Return in 7 weeks (on 04/21/2022) for PP check.   Clarnce Flock, MD

## 2022-03-04 ENCOUNTER — Other Ambulatory Visit: Payer: Self-pay

## 2022-03-05 ENCOUNTER — Other Ambulatory Visit: Payer: Self-pay

## 2022-03-05 ENCOUNTER — Ambulatory Visit: Payer: Self-pay | Admitting: *Deleted

## 2022-03-05 ENCOUNTER — Ambulatory Visit (INDEPENDENT_AMBULATORY_CARE_PROVIDER_SITE_OTHER): Payer: Self-pay

## 2022-03-05 DIAGNOSIS — O24415 Gestational diabetes mellitus in pregnancy, controlled by oral hypoglycemic drugs: Secondary | ICD-10-CM

## 2022-03-09 ENCOUNTER — Inpatient Hospital Stay (HOSPITAL_COMMUNITY): Payer: Medicaid Other | Admitting: Certified Registered"

## 2022-03-09 ENCOUNTER — Other Ambulatory Visit: Payer: Self-pay

## 2022-03-09 ENCOUNTER — Encounter: Payer: Self-pay | Admitting: Family Medicine

## 2022-03-09 ENCOUNTER — Encounter (HOSPITAL_COMMUNITY): Payer: Self-pay | Admitting: Obstetrics and Gynecology

## 2022-03-09 ENCOUNTER — Inpatient Hospital Stay (HOSPITAL_COMMUNITY)
Admission: AD | Admit: 2022-03-09 | Discharge: 2022-03-11 | DRG: 785 | Disposition: A | Discharge status: Home / Self Care | Payer: Medicaid Other | Attending: Obstetrics and Gynecology | Admitting: Obstetrics and Gynecology

## 2022-03-09 ENCOUNTER — Encounter (HOSPITAL_COMMUNITY)
Admission: RE | Admit: 2022-03-09 | Discharge: 2022-03-09 | Disposition: A | Discharge status: Home / Self Care | Payer: Self-pay | Source: Ambulatory Visit | Attending: Family Medicine | Admitting: Family Medicine

## 2022-03-09 ENCOUNTER — Encounter (HOSPITAL_COMMUNITY)
Admission: AD | Disposition: A | Discharge status: Home / Self Care | Payer: Self-pay | Source: Home / Self Care | Attending: Obstetrics and Gynecology

## 2022-03-09 DIAGNOSIS — Z789 Other specified health status: Secondary | ICD-10-CM | POA: Diagnosis present

## 2022-03-09 DIAGNOSIS — O34219 Maternal care for unspecified type scar from previous cesarean delivery: Secondary | ICD-10-CM | POA: Diagnosis present

## 2022-03-09 DIAGNOSIS — O24419 Gestational diabetes mellitus in pregnancy, unspecified control: Secondary | ICD-10-CM

## 2022-03-09 DIAGNOSIS — O9902 Anemia complicating childbirth: Secondary | ICD-10-CM | POA: Diagnosis present

## 2022-03-09 DIAGNOSIS — Z3A38 38 weeks gestation of pregnancy: Secondary | ICD-10-CM

## 2022-03-09 DIAGNOSIS — D649 Anemia, unspecified: Secondary | ICD-10-CM

## 2022-03-09 DIAGNOSIS — O24425 Gestational diabetes mellitus in childbirth, controlled by oral hypoglycemic drugs: Secondary | ICD-10-CM | POA: Diagnosis present

## 2022-03-09 DIAGNOSIS — O0990 Supervision of high risk pregnancy, unspecified, unspecified trimester: Secondary | ICD-10-CM

## 2022-03-09 DIAGNOSIS — Z302 Encounter for sterilization: Secondary | ICD-10-CM | POA: Diagnosis not present

## 2022-03-09 DIAGNOSIS — O24424 Gestational diabetes mellitus in childbirth, insulin controlled: Secondary | ICD-10-CM | POA: Diagnosis not present

## 2022-03-09 DIAGNOSIS — O34211 Maternal care for low transverse scar from previous cesarean delivery: Principal | ICD-10-CM | POA: Diagnosis present

## 2022-03-09 DIAGNOSIS — Z98891 History of uterine scar from previous surgery: Principal | ICD-10-CM

## 2022-03-09 DIAGNOSIS — Z9079 Acquired absence of other genital organ(s): Secondary | ICD-10-CM

## 2022-03-09 DIAGNOSIS — O26893 Other specified pregnancy related conditions, third trimester: Secondary | ICD-10-CM | POA: Diagnosis present

## 2022-03-09 DIAGNOSIS — Z23 Encounter for immunization: Secondary | ICD-10-CM | POA: Diagnosis not present

## 2022-03-09 DIAGNOSIS — Z3490 Encounter for supervision of normal pregnancy, unspecified, unspecified trimester: Secondary | ICD-10-CM

## 2022-03-09 LAB — TYPE AND SCREEN
ABO/RH(D): A POS
Antibody Screen: NEGATIVE

## 2022-03-09 LAB — CBC
HCT: 31.9 % — ABNORMAL LOW (ref 36.0–46.0)
Hemoglobin: 10.2 g/dL — ABNORMAL LOW (ref 12.0–15.0)
MCH: 27.3 pg (ref 26.0–34.0)
MCHC: 32 g/dL (ref 30.0–36.0)
MCV: 85.3 fL (ref 80.0–100.0)
Platelets: 225 10*3/uL (ref 150–400)
RBC: 3.74 MIL/uL — ABNORMAL LOW (ref 3.87–5.11)
RDW: 14.5 % (ref 11.5–15.5)
WBC: 9.1 10*3/uL (ref 4.0–10.5)
nRBC: 0 % (ref 0.0–0.2)

## 2022-03-09 LAB — GLUCOSE, CAPILLARY
Glucose-Capillary: 120 mg/dL — ABNORMAL HIGH (ref 70–99)
Glucose-Capillary: 172 mg/dL — ABNORMAL HIGH (ref 70–99)

## 2022-03-09 LAB — RPR: RPR Ser Ql: NONREACTIVE

## 2022-03-09 SURGERY — Surgical Case
Anesthesia: Spinal

## 2022-03-09 MED ORDER — PHENYLEPHRINE HCL-NACL 20-0.9 MG/250ML-% IV SOLN
INTRAVENOUS | Status: DC | PRN
  Administered 2022-03-09: 40 ug/min via INTRAVENOUS

## 2022-03-09 MED ORDER — DIPHENHYDRAMINE HCL 50 MG/ML IJ SOLN: 12.5000 mg | INTRAMUSCULAR | Status: DC | PRN

## 2022-03-09 MED ORDER — DIBUCAINE (PERIANAL) 1 % EX OINT: 1.0000 | TOPICAL_OINTMENT | CUTANEOUS | Status: DC | PRN

## 2022-03-09 MED ORDER — PHENYLEPHRINE 80 MCG/ML (10ML) SYRINGE FOR IV PUSH (FOR BLOOD PRESSURE SUPPORT)
PREFILLED_SYRINGE | INTRAVENOUS | Status: AC
  Filled 2022-03-09: qty 10

## 2022-03-09 MED ORDER — SOD CITRATE-CITRIC ACID 500-334 MG/5ML PO SOLN
30.0000 mL | ORAL | Status: AC
  Administered 2022-03-09: 30 mL via ORAL
  Filled 2022-03-09: qty 30

## 2022-03-09 MED ORDER — KETOROLAC TROMETHAMINE 30 MG/ML IJ SOLN
30.0000 mg | Freq: Four times a day (QID) | INTRAMUSCULAR | Status: AC | PRN
  Administered 2022-03-09 (×3): 30 mg via INTRAVENOUS

## 2022-03-09 MED ORDER — DEXAMETHASONE SODIUM PHOSPHATE 10 MG/ML IJ SOLN
INTRAMUSCULAR | Status: AC
  Filled 2022-03-09: qty 1

## 2022-03-09 MED ORDER — OXYCODONE HCL 5 MG PO TABS: 5.0000 mg | ORAL_TABLET | Freq: Once | ORAL | Status: DC | PRN

## 2022-03-09 MED ORDER — COCONUT OIL OIL: 1.0000 | TOPICAL_OIL | Status: DC | PRN

## 2022-03-09 MED ORDER — FENTANYL CITRATE (PF) 100 MCG/2ML IJ SOLN
INTRAMUSCULAR | Status: DC | PRN
  Administered 2022-03-09: 15 ug via INTRATHECAL

## 2022-03-09 MED ORDER — BUPIVACAINE IN DEXTROSE 0.75-8.25 % IT SOLN
INTRATHECAL | Status: DC | PRN
  Administered 2022-03-09: 2 mL via INTRATHECAL

## 2022-03-09 MED ORDER — PHENYLEPHRINE 80 MCG/ML (10ML) SYRINGE FOR IV PUSH (FOR BLOOD PRESSURE SUPPORT)
PREFILLED_SYRINGE | INTRAVENOUS | Status: DC | PRN
  Administered 2022-03-09 (×2): 80 ug via INTRAVENOUS

## 2022-03-09 MED ORDER — GABAPENTIN 100 MG PO CAPS
100.0000 mg | ORAL_CAPSULE | Freq: Every day | ORAL | Status: DC
  Administered 2022-03-09 – 2022-03-10 (×2): 100 mg via ORAL
  Filled 2022-03-09 (×2): qty 1

## 2022-03-09 MED ORDER — IBUPROFEN 600 MG PO TABS
600.0000 mg | ORAL_TABLET | Freq: Four times a day (QID) | ORAL | Status: DC
  Administered 2022-03-10 – 2022-03-11 (×6): 600 mg via ORAL
  Filled 2022-03-09 (×6): qty 1

## 2022-03-09 MED ORDER — FENTANYL CITRATE (PF) 100 MCG/2ML IJ SOLN
INTRAMUSCULAR | Status: AC
  Filled 2022-03-09: qty 2

## 2022-03-09 MED ORDER — NALOXONE HCL 0.4 MG/ML IJ SOLN: 0.4000 mg | INTRAMUSCULAR | Status: DC | PRN

## 2022-03-09 MED ORDER — TETANUS-DIPHTH-ACELL PERTUSSIS 5-2.5-18.5 LF-MCG/0.5 IM SUSY
0.5000 mL | PREFILLED_SYRINGE | Freq: Once | INTRAMUSCULAR | Status: AC
  Administered 2022-03-10: 0.5 mL via INTRAMUSCULAR
  Filled 2022-03-09: qty 0.5

## 2022-03-09 MED ORDER — ACETAMINOPHEN 500 MG PO TABS
1000.0000 mg | ORAL_TABLET | Freq: Four times a day (QID) | ORAL | Status: DC
  Administered 2022-03-09 – 2022-03-11 (×8): 1000 mg via ORAL
  Filled 2022-03-09 (×8): qty 2

## 2022-03-09 MED ORDER — OXYTOCIN-SODIUM CHLORIDE 30-0.9 UT/500ML-% IV SOLN
2.5000 [IU]/h | INTRAVENOUS | Status: AC
  Administered 2022-03-09: 2.5 [IU]/h via INTRAVENOUS

## 2022-03-09 MED ORDER — LACTATED RINGERS IV SOLN: INTRAVENOUS | Status: DC | PRN

## 2022-03-09 MED ORDER — OXYCODONE HCL 5 MG/5ML PO SOLN: 5.0000 mg | Freq: Once | ORAL | Status: DC | PRN

## 2022-03-09 MED ORDER — PRENATAL MULTIVITAMIN CH
1.0000 | ORAL_TABLET | Freq: Every day | ORAL | Status: DC
  Administered 2022-03-10 – 2022-03-11 (×2): 1 via ORAL
  Filled 2022-03-09 (×2): qty 1

## 2022-03-09 MED ORDER — PROMETHAZINE HCL 25 MG/ML IJ SOLN: 6.2500 mg | INTRAMUSCULAR | Status: DC | PRN

## 2022-03-09 MED ORDER — ONDANSETRON HCL 4 MG/2ML IJ SOLN: 4.0000 mg | Freq: Three times a day (TID) | INTRAMUSCULAR | Status: DC | PRN

## 2022-03-09 MED ORDER — OXYCODONE HCL 5 MG PO TABS: 5.0000 mg | ORAL_TABLET | ORAL | Status: DC | PRN

## 2022-03-09 MED ORDER — BUPIVACAINE HCL (PF) 0.5 % IJ SOLN
INTRAMUSCULAR | Status: AC
  Filled 2022-03-09: qty 30

## 2022-03-09 MED ORDER — INFLUENZA VAC SPLIT QUAD 0.5 ML IM SUSY
0.5000 mL | PREFILLED_SYRINGE | INTRAMUSCULAR | Status: AC
  Administered 2022-03-10: 0.5 mL via INTRAMUSCULAR
  Filled 2022-03-09: qty 0.5

## 2022-03-09 MED ORDER — ONDANSETRON HCL 4 MG/2ML IJ SOLN
INTRAMUSCULAR | Status: DC | PRN
  Administered 2022-03-09: 4 mg via INTRAVENOUS

## 2022-03-09 MED ORDER — DIPHENHYDRAMINE HCL 25 MG PO CAPS: 25.0000 mg | ORAL_CAPSULE | Freq: Four times a day (QID) | ORAL | Status: DC | PRN

## 2022-03-09 MED ORDER — ENOXAPARIN SODIUM 40 MG/0.4ML IJ SOSY
40.0000 mg | PREFILLED_SYRINGE | INTRAMUSCULAR | Status: DC
  Administered 2022-03-10 – 2022-03-11 (×2): 40 mg via SUBCUTANEOUS
  Filled 2022-03-09 (×2): qty 0.4

## 2022-03-09 MED ORDER — ACETAMINOPHEN 10 MG/ML IV SOLN: 1000.0000 mg | Freq: Once | INTRAVENOUS | Status: DC | PRN

## 2022-03-09 MED ORDER — SCOPOLAMINE 1 MG/3DAYS TD PT72
MEDICATED_PATCH | TRANSDERMAL | Status: AC
  Filled 2022-03-09: qty 1

## 2022-03-09 MED ORDER — KETOROLAC TROMETHAMINE 30 MG/ML IJ SOLN
INTRAMUSCULAR | Status: AC
  Filled 2022-03-09: qty 1

## 2022-03-09 MED ORDER — SIMETHICONE 80 MG PO CHEW: 80.0000 mg | CHEWABLE_TABLET | ORAL | Status: DC | PRN

## 2022-03-09 MED ORDER — MEPERIDINE HCL 25 MG/ML IJ SOLN: 6.2500 mg | INTRAMUSCULAR | Status: DC | PRN

## 2022-03-09 MED ORDER — SCOPOLAMINE 1 MG/3DAYS TD PT72
1.0000 | MEDICATED_PATCH | Freq: Once | TRANSDERMAL | Status: DC
  Administered 2022-03-09: 1.5 mg via TRANSDERMAL

## 2022-03-09 MED ORDER — KETOROLAC TROMETHAMINE 30 MG/ML IJ SOLN: 30.0000 mg | Freq: Four times a day (QID) | INTRAMUSCULAR | Status: AC | PRN

## 2022-03-09 MED ORDER — CEFAZOLIN IN SODIUM CHLORIDE 3-0.9 GM/100ML-% IV SOLN: 3.0000 g | INTRAVENOUS | Status: DC

## 2022-03-09 MED ORDER — SENNOSIDES-DOCUSATE SODIUM 8.6-50 MG PO TABS
2.0000 | ORAL_TABLET | Freq: Every day | ORAL | Status: DC
  Administered 2022-03-10 – 2022-03-11 (×2): 2 via ORAL
  Filled 2022-03-09 (×2): qty 2

## 2022-03-09 MED ORDER — SODIUM CHLORIDE 0.9% FLUSH: 3.0000 mL | INTRAVENOUS | Status: DC | PRN

## 2022-03-09 MED ORDER — MORPHINE SULFATE (PF) 0.5 MG/ML IJ SOLN
INTRAMUSCULAR | Status: DC | PRN
  Administered 2022-03-09: 150 ug via INTRATHECAL

## 2022-03-09 MED ORDER — DEXTROSE 5 % IV SOLN
INTRAVENOUS | Status: DC | PRN
  Administered 2022-03-09: 3 g via INTRAVENOUS

## 2022-03-09 MED ORDER — DIPHENHYDRAMINE HCL 25 MG PO CAPS: 25.0000 mg | ORAL_CAPSULE | ORAL | Status: DC | PRN

## 2022-03-09 MED ORDER — ONDANSETRON HCL 4 MG/2ML IJ SOLN
INTRAMUSCULAR | Status: AC
  Filled 2022-03-09: qty 2

## 2022-03-09 MED ORDER — MENTHOL 3 MG MT LOZG: 1.0000 | LOZENGE | OROMUCOSAL | Status: DC | PRN

## 2022-03-09 MED ORDER — SODIUM CHLORIDE 0.9 % IV SOLN
500.0000 mg | INTRAVENOUS | Status: AC
  Administered 2022-03-09: 500 mg via INTRAVENOUS

## 2022-03-09 MED ORDER — WITCH HAZEL-GLYCERIN EX PADS: 1.0000 | MEDICATED_PAD | CUTANEOUS | Status: DC | PRN

## 2022-03-09 MED ORDER — KETOROLAC TROMETHAMINE 30 MG/ML IJ SOLN
30.0000 mg | Freq: Four times a day (QID) | INTRAMUSCULAR | Status: AC
  Administered 2022-03-10: 30 mg via INTRAVENOUS
  Filled 2022-03-09 (×3): qty 1

## 2022-03-09 MED ORDER — HYDROMORPHONE HCL 1 MG/ML IJ SOLN: 0.2500 mg | INTRAMUSCULAR | Status: DC | PRN

## 2022-03-09 MED ORDER — HYDROMORPHONE HCL 1 MG/ML IJ SOLN: 0.2000 mg | INTRAMUSCULAR | Status: DC | PRN

## 2022-03-09 MED ORDER — SIMETHICONE 80 MG PO CHEW
80.0000 mg | CHEWABLE_TABLET | Freq: Three times a day (TID) | ORAL | Status: DC
  Administered 2022-03-09 – 2022-03-11 (×6): 80 mg via ORAL
  Filled 2022-03-09 (×6): qty 1

## 2022-03-09 MED ORDER — NALOXONE HCL 4 MG/10ML IJ SOLN: 1.0000 ug/kg/h | INTRAVENOUS | Status: DC | PRN

## 2022-03-09 MED ORDER — BUPIVACAINE HCL (PF) 0.25 % IJ SOLN
INTRAMUSCULAR | Status: AC
  Filled 2022-03-09: qty 30

## 2022-03-09 MED ORDER — DEXAMETHASONE SODIUM PHOSPHATE 10 MG/ML IJ SOLN
INTRAMUSCULAR | Status: DC | PRN
  Administered 2022-03-09: 4 mg via INTRAVENOUS

## 2022-03-09 MED ORDER — MORPHINE SULFATE (PF) 0.5 MG/ML IJ SOLN
INTRAMUSCULAR | Status: AC
  Filled 2022-03-09: qty 10

## 2022-03-09 SURGICAL SUPPLY — 31 items
BENZOIN TINCTURE PRP APPL 2/3 (GAUZE/BANDAGES/DRESSINGS) IMPLANT
CHLORAPREP W/TINT 26ML (MISCELLANEOUS) ×2 IMPLANT
CLAMP UMBILICAL CORD (MISCELLANEOUS) ×1 IMPLANT
CLOTH BEACON ORANGE TIMEOUT ST (SAFETY) ×1 IMPLANT
CLSR STERI-STRIP ANTIMIC 1/2X4 (GAUZE/BANDAGES/DRESSINGS) IMPLANT
DRSG OPSITE POSTOP 4X10 (GAUZE/BANDAGES/DRESSINGS) ×1 IMPLANT
ELECT REM PT RETURN 9FT ADLT (ELECTROSURGICAL)
ELECTRODE REM PT RTRN 9FT ADLT (ELECTROSURGICAL) ×1 IMPLANT
EXTRACTOR VACUUM KIWI (MISCELLANEOUS) IMPLANT
GLOVE BIO SURGEON STRL SZ 6 (GLOVE) ×1 IMPLANT
GLOVE BIOGEL PI IND STRL 7.0 (GLOVE) ×2 IMPLANT
GOWN STRL REUS W/TWL LRG LVL3 (GOWN DISPOSABLE) ×2 IMPLANT
KIT ABG SYR 3ML LUER SLIP (SYRINGE) IMPLANT
NDL HYPO 25X5/8 SAFETYGLIDE (NEEDLE) IMPLANT
NEEDLE HYPO 22GX1.5 SAFETY (NEEDLE) IMPLANT
NEEDLE HYPO 25X5/8 SAFETYGLIDE (NEEDLE) IMPLANT
NS IRRIG 1000ML POUR BTL (IV SOLUTION) ×1 IMPLANT
PACK C SECTION WH (CUSTOM PROCEDURE TRAY) ×1 IMPLANT
PAD ABD 7.5X8 STRL (GAUZE/BANDAGES/DRESSINGS) IMPLANT
PAD OB MATERNITY 4.3X12.25 (PERSONAL CARE ITEMS) ×1 IMPLANT
RETRACTOR WND ALEXIS 25 LRG (MISCELLANEOUS) IMPLANT
RTRCTR WOUND ALEXIS 25CM LRG (MISCELLANEOUS) ×1
SUT MON AB 4-0 PS1 27 (SUTURE) ×1 IMPLANT
SUT PLAIN 0 NONE (SUTURE) ×1 IMPLANT
SUT VIC AB 0 CT1 36 (SUTURE) ×3 IMPLANT
SUT VIC AB 2-0 CT1 27 (SUTURE) ×1
SUT VIC AB 2-0 CT1 TAPERPNT 27 (SUTURE) ×1 IMPLANT
SYR CONTROL 10ML LL (SYRINGE) IMPLANT
TOWEL OR 17X24 6PK STRL BLUE (TOWEL DISPOSABLE) ×1 IMPLANT
TRAY FOLEY W/BAG SLVR 14FR LF (SET/KITS/TRAYS/PACK) IMPLANT
WATER STERILE IRR 1000ML POUR (IV SOLUTION) ×1 IMPLANT

## 2022-03-09 NOTE — MAU Note (Signed)
.  Rebecca Holder is a 31 y.o. at [redacted]w[redacted]d here in MAU reporting: CTX and pressure since 2200 yesterday. Pt states she lost her mucus plug around 0000. Pt reports a right sided epigastric pain that is intermittent with or without ctx. Pt denies VB, LOF, DFM, recent intercourse,. GDM on metformin  Previous C/S, VBAC x2, desires repeat C/S  Onset of complaint: 2200 yesterday  Pain score: 8/10 Vitals:   03/09/22 0205  BP: 123/81  Pulse: (!) 101  Resp: 18  Temp: 98.3 F (36.8 C)  SpO2: 99%     FHT:160 Lab orders placed from triage:

## 2022-03-09 NOTE — Anesthesia Preprocedure Evaluation (Signed)
Anesthesia Evaluation  Patient identified by MRN, date of birth, ID band Patient awake    Reviewed: Allergy & Precautions, H&P , NPO status , Patient's Chart, lab work & pertinent test results  History of Anesthesia Complications Negative for: history of anesthetic complications  Airway Mallampati: II  TM Distance: >3 FB     Dental   Pulmonary neg pulmonary ROS,    Pulmonary exam normal        Cardiovascular negative cardio ROS   Rhythm:regular Rate:Normal     Neuro/Psych negative neurological ROS  negative psych ROS   GI/Hepatic negative GI ROS, Neg liver ROS,   Endo/Other  diabetes, Gestational, Oral Hypoglycemic Agents  Renal/GU negative Renal ROS  negative genitourinary   Musculoskeletal   Abdominal   Peds  Hematology  (+) Blood dyscrasia, anemia ,   Anesthesia Other Findings   Reproductive/Obstetrics (+) Pregnancy G4P2003 at [redacted]w[redacted]d                             Anesthesia Physical Anesthesia Plan  ASA: 2  Anesthesia Plan: Spinal   Post-op Pain Management:    Induction:   PONV Risk Score and Plan: Ondansetron and Treatment may vary due to age or medical condition  Airway Management Planned:   Additional Equipment:   Intra-op Plan:   Post-operative Plan:   Informed Consent: I have reviewed the patients History and Physical, chart, labs and discussed the procedure including the risks, benefits and alternatives for the proposed anesthesia with the patient or authorized representative who has indicated his/her understanding and acceptance.       Plan Discussed with: Anesthesiologist  Anesthesia Plan Comments:         Anesthesia Quick Evaluation

## 2022-03-09 NOTE — Discharge Summary (Signed)
Postpartum Discharge Summary  Date of Service updated***     Patient Name: Rebecca Holder DOB: 09-07-1990 MRN: [id number]  Date of admission: 03/09/2022 Delivery date:03/09/2022  Delivering provider: Radene Gunning  Date of discharge: 03/09/2022  Admitting diagnosis: Pregnancy [Z34.90] Intrauterine pregnancy: [redacted]w[redacted]d    Secondary diagnosis:  Principal Problem:   Pregnancy Active Problems:   S/P repeat low transverse C-section   Status post bilateral salpingectomy  Additional problems: ***    Discharge diagnosis: Term Pregnancy Delivered, GDM A2, and H/o bilateral salpingectomy                                               Post partum procedures:{Postpartum procedures:23558} Augmentation: N/A Complications: {OB Labor/Delivery Complications:20784}  Hospital course: Onset of Labor With Unplanned C/S   31y.o. yo [id number] 321w6das admitted in Latent Labor on 03/09/2022. The patient went for cesarean section due to Elective Repeat. Delivery details as follows: Membrane Rupture Time/Date: 4:48 AM ,03/09/2022   Delivery Method:C-Section, Low Transverse  Details of operation can be found in separate operative note. Patient had an uncomplicated postpartum course.  She is ambulating,tolerating a regular diet, passing flatus, and urinating well.  Patient is discharged home in stable condition 03/09/22.  Newborn Data: Birth date:03/09/2022  Birth time:4:49 AM  Gender:Female  Living status:Living  Apgars:8 ,8  Weight:   Magnesium Sulfate received: No BMZ received: No Rhophylac:No MMR:No T-DaP:Given prenatally Flu: {F{SLP:53005}ransfusion:{Transfusion received:30440034}  Physical exam  Vitals:   03/09/22 0205 03/09/22 0210  BP: 123/81 123/81  Pulse: (!) 101 81  Resp: 18   Temp: 98.3 F (36.8 C)   TempSrc: Oral   SpO2: 99% 98%  Height: '5\' 4"'  (1.626 m)    General: {Exam; general:21111117} Lochia: {Desc; appropriate/inappropriate:30686::"appropriate"} Uterine  Fundus: {Desc; firm/soft:30687} Incision: {Exam; incision:21111123} DVT Evaluation: {Exam; dvRTM:2111735}abs: Lab Results  Component Value Date   WBC 9.1 03/09/2022   HGB 10.2 (L) 03/09/2022   HCT 31.9 (L) 03/09/2022   MCV 85.3 03/09/2022   PLT 225 03/09/2022       No data to display         Edinburgh Score:    01/02/2017    1:00 AM  Edinburgh Postnatal Depression Scale Screening Tool  I have been able to laugh and see the funny side of things. 0  I have looked forward with enjoyment to things. 2  I have blamed myself unnecessarily when things went wrong. 1  I have been anxious or worried for no good reason. 2  I have felt scared or panicky for no good reason. 2  Things have been getting on top of me. 2  I have been so unhappy that I have had difficulty sleeping. 2  I have felt sad or miserable. 2  I have been so unhappy that I have been crying. 1  The thought of harming myself has occurred to me. 0  Edinburgh Postnatal Depression Scale Total 14     After visit meds:  Allergies as of 03/09/2022   No Known Allergies   Med Rec must be completed prior to using this [facility]        Discharge home in stable condition Infant Feeding: Bottle and Breast Infant Disposition:home with mother Discharge instruction: per After Visit Summary and Postpartum booklet. Activity: Advance as tolerated. Pelvic rest for 6 weeks.  Diet: routine diet Future Appointments: Future Appointments  Date Time Provider [place]  03/09/2022 10:00 AM [facility]-LD PAT 1 [facility]-INDC None  04/21/2022  8:35 AM Clarnce Flock, MD [facility]   Follow up Visit:  Message sent to [facility] by Autry-Lott on 03/09/2022  Please schedule this patient for a In person postpartum visit in 6 weeks with the following provider: MD and APP. Additional Postpartum F/U:2 hour GTT and Incision check 1 week  High risk pregnancy complicated by: GDM Delivery mode:  C-Section, Low Transverse  Anticipated Birth  Control:  BTL done [facility]   03/09/2022 Simone Autry-Lott, DO

## 2022-03-09 NOTE — Transfer of Care (Signed)
Immediate Anesthesia Transfer of Care Note  Patient: Rebecca Holder  Procedure(s) Performed: CESAREAN SECTION WITH BILATERAL TUBAL LIGATION  Patient Location: PACU  Anesthesia Type:Regional and Spinal  Level of Consciousness: awake, alert , oriented and patient cooperative  Airway & Oxygen Therapy: Patient Spontanous Breathing  Post-op Assessment: Report given to RN and Post -op Vital signs reviewed and stable  Post vital signs: Reviewed and stable  Last Vitals:  Vitals Value Taken Time  BP 92/70 03/09/22 0554  Temp 36.4 C 03/09/22 0551  Pulse 75 03/09/22 0555  Resp 19 03/09/22 0555  SpO2 97 % 03/09/22 0555  Vitals shown include unvalidated device data.  Last Pain:  Vitals:   03/09/22 0551  TempSrc: Oral  PainSc:          Complications: No notable events documented.

## 2022-03-09 NOTE — Anesthesia Procedure Notes (Signed)
Spinal  Patient location during procedure: OR Reason for block: surgical anesthesia Staffing Performed: anesthesiologist  Anesthesiologist: Melana Hingle E, MD Performed by: Eulalie Speights E, MD Authorized by: Judson Tsan E, MD   Preanesthetic Checklist Completed: patient identified, IV checked, risks and benefits discussed, surgical consent, monitors and equipment checked, pre-op evaluation and timeout performed Spinal Block Patient position: sitting Prep: DuraPrep and site prepped and draped Patient monitoring: continuous pulse ox, blood pressure and heart rate Approach: midline Location: L3-4 Injection technique: single-shot Needle Needle type: Pencan  Needle gauge: 24 G Needle length: 10 cm Assessment Events: CSF return Additional Notes Functioning IV was confirmed and monitors were applied. Sterile prep and drape, including hand hygiene and sterile gloves were used. The patient was positioned and the spine was prepped. The skin was anesthetized with lidocaine.  Free flow of clear CSF was obtained prior to injecting local anesthetic into the CSF. The needle was carefully withdrawn. The patient tolerated the procedure well.      

## 2022-03-09 NOTE — H&P (Addendum)
OBSTETRIC ADMISSION HISTORY AND PHYSICAL  Rebecca Holder is a 31 y.o. female [phone] with IUP at [redacted]w[redacted]d by L=32w [place] presenting for labor. She reports +FMs, No LOF, no VB, no blurry vision, headaches or peripheral edema, and RUQ pain.  She plans on breast-feeding. She request BTL for birth control. She received her prenatal care at  [place]: By LMP --->  Estimated Date of Delivery: 03/17/22  Sono:    @[redacted]w[redacted]d , CWD, normal anatomy, vertex presentation, 3517g, 98% EFW   Prenatal History/Complications:  GDMA2 - MTF 500/1000 H/o C-section Language barrier - Spanish speaking  Past Medical History: Past Medical History:  Diagnosis Date   Gestational diabetes     Past Surgical History: Past Surgical History:  Procedure Laterality Date   CESAREAN SECTION      Obstetrical History: OB History     Gravida  4   Para  3   Term  2   Preterm  0   AB  0   Living  3      SAB  0   IAB  0   Ectopic  0   Multiple  0   Live Births  3           Social History Social History   Socioeconomic History   Marital status: Single    Spouse name: Not on file   Number of children: Not on file   Years of education: Not on file   Highest education level: Not on file  Occupational History   Not on file  Tobacco Use   Smoking status: Never   Smokeless tobacco: Not on file  Vaping Use   Vaping Use: Never used  Substance and Sexual Activity   Alcohol use: No   Drug use: Not Currently   Sexual activity: Not Currently    Comment: would like tubal ligation  Other Topics Concern   Not on file  Social History Narrative   Not on file   Social Determinants of Health   Financial Resource Strain: Not on file  Food Insecurity: No Food Insecurity (02/03/2022)   Hunger Vital Sign    Worried About Running Out of Food in the Last Year: Never true    Ran Out of Food in the Last Year: Never true  Recent Concern: Food Insecurity - Food Insecurity Present (01/01/2022)    Hunger Vital Sign    Worried About Running Out of Food in the Last Year: Often true    Ran Out of Food in the Last Year: Often true  Transportation Needs: No Transportation Needs (02/03/2022)   PRAPARE - Hydrologist (Medical): No    Lack of Transportation (Non-Medical): No  Physical Activity: Not on file  Stress: Not on file  Social Connections: Not on file    Family History: Family History  Problem Relation Age of Onset   Thyroid disease Mother    Thyroid disease Brother     Allergies: No Known Allergies  Medications Prior to Admission  Medication Sig Dispense Refill Last Dose   metFORMIN (GLUCOPHAGE) 500 MG tablet Take 1 tablet (500 mg total) by mouth 2 (two) times daily with a meal. 60 tablet 1 Past Week   Prenatal Vit-Fe Fumarate-FA (MULTIVITAMIN-PRENATAL) 27-0.8 MG TABS tablet Take 1 tablet by mouth daily at 12 noon. 30 each 5 Past Week     Review of Systems   All systems reviewed and negative except as stated in HPI  Blood  pressure 123/81, pulse (!) 101, temperature 98.3 F (36.8 C), temperature source Oral, resp. rate 18, height 5\' 4"  (1.626 m), last menstrual period 06/10/2021, SpO2 99 %, unknown if currently breastfeeding. General appearance: alert, cooperative, and mild distress Lungs: clear to auscultation bilaterally Heart: regular rate and rhythm Abdomen: soft, non-tender; bowel sounds normal Pelvic: 4.5 cm per RN Extremities: Homans sign is negative, no sign of DVT Presentation: cephalic Fetal monitoringBaseline: 150 bpm, Variability: Good {> 6 bpm), Accelerations: Reactive, and Decelerations: Earlyvs late decels on initial arrival but now category 1  Uterine activity Q3-5 min Dilation: 4.5 Effacement (%): 80 Station: -3 Exam by:: [email or web address], RN   Prenatal labs: ABO, Rh: A/Positive/-- (07/27 1406) Antibody: Negative (07/27 1406) Rubella: 4.10 (07/27 1406) RPR: Non Reactive (07/27 1406)  HBsAg: Negative (07/27 1406)   HIV: Non Reactive (07/27 1406)  GBS: Negative/-- (09/12 1011)  2 hr Glucola abnormal- GDM Genetic screening  LR female Anatomy 09-13-1982 Normal  Prenatal Transfer Tool  Maternal Diabetes: Yes:  Diabetes Type:  Insulin/Medication controlled Genetic Screening: Normal Maternal Ultrasounds/Referrals: Normal Fetal Ultrasounds or other Referrals:  None Maternal Substance Abuse:  No Significant Maternal Medications:  Meds include: Other: MTF Significant Maternal Lab Results:  Group B Strep negative Number of Prenatal Visits:greater than 3 verified prenatal visits Other Comments:  None  No results found for this or any previous visit (from the past 24 hour(s)).  Patient Active Problem List   Diagnosis Date Noted   Pregnancy 03/09/2022   Previous cesarean section complicating pregnancy 02/03/2022   White classification A2 gestational diabetes mellitus 01/01/2022   Supervision of high risk pregnancy, antepartum 01/01/2022   Illiterate 12/26/2014   Language barrier 12/26/2014   Cervical polyp 12/26/2014    Assessment/Plan:  Rebecca Holder is a 31 y.o. G4P2003 at [redacted]w[redacted]d here for labor. She declines TOLAC. She would like RLTCS/BTL. Per prior notes, she has paid ahead for her tubal ligation.   #Labor: In early labor #Pain: Per anesthesia #FWB: Now category 1  #ID:  Ancef/Azithro preop #MOF: Breast #MOC: BTL  The risks of surgery were discussed with the patient including but were not limited to: bleeding which may require transfusion or reoperation; infection which may require antibiotics; injury to bowel, bladder, ureters or other surrounding organs; injury to the fetus; need for additional procedures including hysterectomy in the event of a life-threatening hemorrhage; formation of adhesions; placental abnormalities with subsequent pregnancies; incisional problems; thromboembolic phenomenon and other postoperative/anesthesia complications.    Discussed the permanent nature of a tubal  and that it is not reversible. She desires no more children ever.   The patient concurred with the proposed plan, giving informed written consent for the procedure.   Patient has been NPO since 7pm and she will remain NPO for procedure. Anesthesia and OR aware. Preoperative prophylactic antibiotics and SCDs ordered on call to the OR.  To OR when ready.  Spanish interpreter via the video used throughout.    [redacted]w[redacted]d, MD  03/09/2022, 2:56 AM

## 2022-03-09 NOTE — Op Note (Signed)
Rebecca Holder PROCEDURE DATE: 03/09/2022  PREOPERATIVE DIAGNOSES: Intrauterine pregnancy at [redacted]w[redacted]d weeks gestation; patient declines vag del attempt and history of c-section; unwanted fertility  POSTOPERATIVE DIAGNOSES: The same  PROCEDURE: Low Transverse Cesarean Section w/ bilateral salpingectomy  SURGEON:  Dr. Para March  ASSISTANT:  Dr. Salvadore Dom. An experienced assistant was required given the standard of surgical care given the complexity of the case.  This assistant was needed for exposure, dissection, suctioning, retraction, instrument exchange, assisting with delivery with administration of fundal pressure, and for overall help during the procedure.  ANESTHESIOLOGY TEAM: Anesthesiologist: Lucretia Kern, MD CRNA: Sonda Primes, CRNA  INDICATIONS: Rebecca Holder is a 31 y.o. [phone] at [redacted]w[redacted]d here for cesarean section secondary to the indications listed under preoperative diagnoses; please see preoperative note for further details.  The risks of surgery were discussed with the patient including but were not limited to: bleeding which may require transfusion or reoperation; infection which may require antibiotics; injury to bowel, bladder, ureters or other surrounding organs; injury to the fetus; need for additional procedures including hysterectomy in the event of a life-threatening hemorrhage; formation of adhesions; placental abnormalities wth subsequent pregnancies; incisional problems; thromboembolic phenomenon and other postoperative/anesthesia complications.  The patient concurred with the proposed plan, giving informed written consent for the procedure.    FINDINGS:  Viable female infant in cephalic presentation.  Apgars 8 and 8.  Clear amniotic fluid.  Intact placenta, three vessel cord.  Normal uterus, fallopian tubes and ovaries bilaterally.  ANESTHESIA: Spinal INTRAVENOUS FLUIDS: 1700 ml   ESTIMATED BLOOD LOSS: 262 ml URINE OUTPUT:  75 ml SPECIMENS:  Placenta sent to L&D; fallopian tubes to pathology COMPLICATIONS: None immediate  PROCEDURE IN DETAIL:  The patient preoperatively received intravenous antibiotics and had sequential compression devices applied to her lower extremities.  She was then taken to the operating room where spinal anesthesia was administered and was found to be adequate. She was then placed in a dorsal supine position with a leftward tilt, and prepped and draped in a sterile manner.  A foley catheter was placed into her bladder and attached to constant gravity.  After an adequate timeout was performed, a Pfannenstiel skin incision was made with scalpel on her preexisting scar and carried through to the underlying layer of fascia. The fascia was incised in the midline, and this incision was extended bilaterally in a blunt fashion.  The underlying rectus muscles were dissected off the fascia superiorly and inferiorly in a blunt fashion. The rectus muscles were separated in the midline and the peritoneum was entered bluntly. The Alexis self-retaining retractor was introduced into the abdominal cavity.  Attention was turned to the lower uterine segment where a low transverse hysterotomy was made with a scalpel and extended bilaterally bluntly.  The infant was successfully delivered, the cord was clamped and cut after one minute, and the infant was handed over to the awaiting neonatology team. Uterine massage was then administered, and the placenta delivered intact with a three-vessel cord. The uterus was then cleared of clots and debris. The hysterotomy was closed with 0 Vicryl in a running unlocked fashion. Interrupted 0 Vicryl serosal stitches were placed to help with hemostasis.  The pelvis was cleared of all clot and debris.    The uterus was exteriorized. Attention was then turned to the fallopian tubes and ovaries; normal anatomy noted. Bilateral salpingectomy: A Kelly clamp was placed across the left fallopian tube taking care to  incorporate the fimbriae. A second clamp was then  placed below the first. The fallopian tube was then removed with Metzenbaum scissors. The pedicle was then ligated with 2-0 plain gut suture and the second clamp was removed with excellent hemostasis noted. Then a second ligature of plain gut suture was placed below the remaining clamp, the clamp was then removed and again excellent hemostasis was observed. The same procedure was then carried out on the right fallopian tube with excellent hemostasis noted. The uterus was placed carefully back into the pelvis.  Hemostasis was confirmed on all surfaces.  The retractor was removed.  The peritoneum was closed with a 0 Vicryl running stitch. The fascia was then closed using 0 Vicryl in a running fashion.  The subcutaneous layer was irrigated, and the skin was closed with a 4-0 monocryl subcuticular stitch. The patient tolerated the procedure well. Sponge, instrument and needle counts were correct x 3.  She was taken to the recovery room in stable condition.   [name], DO OB Fellow, [place] [facility] 03/09/2022, 5:29 AM

## 2022-03-09 NOTE — Anesthesia Postprocedure Evaluation (Signed)
Anesthesia Post Note  Patient: Production designer, theatre/television/film  Procedure(s) Performed: CESAREAN SECTION WITH BILATERAL TUBAL LIGATION     Patient location during evaluation: PACU Anesthesia Type: Spinal Level of consciousness: oriented and awake and alert Pain management: pain level controlled Vital Signs Assessment: post-procedure vital signs reviewed and stable Respiratory status: spontaneous breathing, respiratory function stable and nonlabored ventilation Cardiovascular status: blood pressure returned to baseline and stable Postop Assessment: no headache, no backache, no apparent nausea or vomiting and spinal receding Anesthetic complications: no   No notable events documented.  Last Vitals:  Vitals:   03/09/22 1121 03/09/22 1229  BP: (!) 99/58 (!) 93/58  Pulse: 60 (!) 58  Resp: 16 16  Temp: 36.8 C 36.7 C  SpO2: 98% 98%    Last Pain:  Vitals:   03/09/22 1229  TempSrc: Oral  PainSc: 0-No pain   Pain Goal: Patients Stated Pain Goal: 3 (03/09/22 5852)                 Lidia Collum

## 2022-03-09 NOTE — Lactation Note (Addendum)
This note was copied from a baby's chart. Lactation Consultation Note  Patient Name: Rebecca Holder [id number] Date: 03/09/2022 Reason for consult: Initial assessment;Early term 37-38.6wks;Maternal endocrine disorder;Breastfeeding assistance Age:33 hours  P4, Early Term, Infant Female, Experienced Breastfeeding Parent  Interpreter Jacqulyn Bath [phone] Used  LC entered the room and the infant was being held STS.  Per the birth parent, she has breast fed each of her other children with no issues.  LC showed the birth parent how to hand express.  No drops were noted.  Per the [employer], the infant had a recent blood glucose of 38 and it will be checked again at 1300.  LC spoke with the birth parent about outpatient services, feeding the infant frequently to assist with blood sugar, and supplementing if necessary.  The infant latched to the left breast in the cross-cradle hold.  Tongue was down, lips were flanged, sucking was rhythmic, and some swallows were noted.  The birth parent had no further questions or concerns.  She does not have a pump at home.  LC encouraged the birth parent to call [place] to speak with them about getting a pump.   Current Feeding Plan:  Breastfeed according to feeding cues 8+ times in 24 hours.  Put the infant to the breast prior to supplementing (if necessary).  Call the [place] for assistance with breastfeeding.   Maternal Data    Feeding Nipple Type: Slow - flow  LATCH Score Latch: Grasps breast easily, tongue down, lips flanged, rhythmical sucking.  Audible Swallowing: A few with stimulation  Type of Nipple: Everted at rest and after stimulation  Comfort (Breast/Nipple): Soft / non-tender  Hold (Positioning): No assistance needed to correctly position infant at breast.  LATCH Score: 9   Lactation Tools Discussed/Used    Interventions Interventions: [facility] brochure;Education  Discharge Pump: Advised to call insurance company [facility]  Program: Yes  Consult Status Consult Status: Follow-up Date: 03/10/22    Lysbeth Penner 03/09/2022, 12:45 PM

## 2022-03-10 ENCOUNTER — Encounter (HOSPITAL_COMMUNITY): Payer: Self-pay | Admitting: Obstetrics and Gynecology

## 2022-03-10 ENCOUNTER — Inpatient Hospital Stay (HOSPITAL_COMMUNITY): Admission: AD | Admit: 2022-03-10 | Payer: Self-pay | Source: Home / Self Care | Admitting: Family Medicine

## 2022-03-10 LAB — GLUCOSE, CAPILLARY
Glucose-Capillary: 131 mg/dL — ABNORMAL HIGH (ref 70–99)
Glucose-Capillary: 90 mg/dL (ref 70–99)
Glucose-Capillary: 174 mg/dL — ABNORMAL HIGH (ref 70–99)
Glucose-Capillary: 168 mg/dL — ABNORMAL HIGH (ref 70–99)

## 2022-03-10 LAB — CBC
HCT: 25.8 % — ABNORMAL LOW (ref 36.0–46.0)
Hemoglobin: 8.2 g/dL — ABNORMAL LOW (ref 12.0–15.0)
MCH: 27.2 pg (ref 26.0–34.0)
MCHC: 31.8 g/dL (ref 30.0–36.0)
MCV: 85.4 fL (ref 80.0–100.0)
Platelets: 188 10*3/uL (ref 150–400)
RBC: 3.02 MIL/uL — ABNORMAL LOW (ref 3.87–5.11)
RDW: 14.6 % (ref 11.5–15.5)
WBC: 10.6 10*3/uL — ABNORMAL HIGH (ref 4.0–10.5)
nRBC: 0 % (ref 0.0–0.2)

## 2022-03-10 LAB — SURGICAL PATHOLOGY

## 2022-03-10 MED ORDER — SODIUM CHLORIDE 0.9 % IV SOLN
500.0000 mg | Freq: Once | INTRAVENOUS | Status: AC
  Administered 2022-03-10: 500 mg via INTRAVENOUS
  Filled 2022-03-10: qty 500

## 2022-03-10 NOTE — Progress Notes (Signed)
POSTPARTUM PROGRESS NOTE  POD #1  Subjective:  Rebecca Holder is a 31 y.o. G2B6389 s/p rLTCS with BTL at [redacted]w[redacted]d. No acute events overnight. She reports she is doing well. She denies any problems with ambulating, voiding or po intake. Denies nausea or vomiting. She has passed flatus. Pain is well controlled.  Lochia is adeqaute.  Objective: Blood pressure 101/68, pulse 65, temperature 98.3 F (36.8 C), temperature source Oral, resp. rate 16, height 5\' 4"  (1.626 m), last menstrual period 06/10/2021, SpO2 97 %, currently breastfeeding.  Physical Exam:  General: alert, cooperative and no distress Chest: no respiratory distress Heart: regular rate, distal pulses intact Uterine Fundus: firm, appropriately tender DVT Evaluation: No calf swelling or tenderness Extremities: mild edema Skin: warm, dry; incision clean/dry/intact w/ pressure and honeycomb dressing in place  Recent Labs    03/09/22 0305 03/10/22 0457  HGB 10.2* 8.2*  HCT 31.9* 25.8*    Assessment/Plan: Rebecca Holder is a 31 y.o. H7D4287 s/p s/p rLTCS with BTL  at [redacted]w[redacted]d for elective.  POD#1 - Doing welll; pain well controlled. H/H appropriate  Routine postpartum care  OOB, ambulated  Lovenox for VTE prophylaxis Anemia: asymptomatic  IV Venofer 500mg  x1 today Contraception: BTL done Feeding: breast and bottle  Dispo: Plan for discharge in the next 24-48h.   LOS: 1 day   Shelda Pal, DO OB Fellow  03/10/2022, 7:15 AM

## 2022-03-10 NOTE — Progress Notes (Signed)
Patient did not call out to inform this RN before eating lunch tray, so unable to obtain [facility] lunch CBG. Using in-house interpreter Eyvonne Mechanic, this RN reminded patient to call out for CBG checks prior to consuming meal trays. Patient verbalized understanding.

## 2022-03-10 NOTE — Progress Notes (Signed)
CBG AC dinner at 1939 was 174. I informed first call L&D. She said she would take a look and put new orders in.

## 2022-03-10 NOTE — Social Work (Addendum)
CSW received consult for hx of Anxiety and Depression.  CSW met with MOB to offer support and complete assessment.    #[id number] interpreter used.   CSW met with MOB at bedside and introduced CSW role. CSW observed MOB sitting on the bed with the infant lying next to her. MOB presented calm and welcomed CSW visit. CSW inquired how MOB has felt since giving birth. MOB reported that she has been "feeling good." MOB shared during L&D she was "a little nervous but things went well." CSW inquired how MOB felt during the pregnancy. MOB reported "at times I was happy and not happy." CSW assessed further. MOB reported that her mom and sister are her only supports. Sometimes they were present to help her and other times they were not. MOB reported that FOB was involved in the beginning of the pregnancy but "stepped away towards the end." She is not sure why he stepped away. CSW inquired about MOB history of anxiety and depression. MOB denied a history of anxiety and depression. CSW inquired if MOB felt sad most of time that impacted her ability to complete daily activities or feel happy. MOB reported no. CSW discussed PPD/PPA. MOB reported that was knowledgeable of PPD/PPA and receptive to the resources provided. CSW provided education regarding the baby blues period vs. perinatal mood disorders, discussed treatment and gave resources for mental health follow up if concerns arise. MOB reported that she has been interested in seeing a therapist. CSW asked MOB about the behavioral health services that were offered to her during the pregnancy. MOB reported she did not want to use the services because they did not offer a person who speaks Spanish. CSW informed MOB that interpreter services are offered. MOB reported that she does not want to have to explain how she feels to an interpreter. CSW offered to reach out to agencies that accept non insured, [facility] and [facility] to determine if they have  a therapist that speaks Spanish on staff.   CSW recommended MOB complete self-evaluation during the postpartum time period using the New Mom Checklist from [employer] and encouraged MOB to contact a medical professional if symptoms are noted at any time. CSW assessed MOB for safety. MOB denied thoughts of harm to self and others. MOB denied domestic violence concerns.   MOB reported she has some diapers, wipes, clothes, and a crib for the infant. MOB reported she will formula feed the infant. She receives WIC/FS. CSW inquired about food insecurities. MOB reported sometimes she has limited food in the home. CSW provided MOB with a list of food pantries for [place]. CSW discussed community services and offered to make a referral to [facility]. CSW provided review of Sudden Infant Death Syndrome (SIDS) precautions. MOB has chosen [facility] for the infant's follow up care. CSW inquired if MOB will have transportation to the appointment. MOB reported she takes the bus but will ask her sister to transport her to the infant's appointment. CSW assessed MOB for additional needs. MOB reported no further need.   CSW identifies no further need for intervention and no barriers to discharge at this time.  Oksana Deberry, MSW, LCSW [facility]  Clinical Social Worker  [phone] 03/10/2022  3:15 PM  

## 2022-03-10 NOTE — Progress Notes (Signed)
Called Dr. Kerrie Pleasure regarding bedtime blood sugar of 168. No new orders given at this time.

## 2022-03-10 NOTE — Lactation Note (Signed)
This note was copied from a baby's chart. Lactation Consultation Note  Patient Name: Rebecca Holder [id number] Date: 03/10/2022 Reason for consult: Follow-up assessment Age:33 hours  Spanish interpreter used. P4, Mother is breastfeeding and supplementing with formula. Baby has had 6 voids and 4 stools in the last 24 hours.  Baby is sleeping after approx. 1 hour breastfeeding session. Lactation will follow up to view next feeding. Encouraged mother to offer breast before formula.  Feeding Mother's Current Feeding Choice: Breast Milk and Formula  Interventions Interventions: Breast feeding basics reviewed;Education  Consult Status Consult Status: Follow-up Date: 03/11/22 Follow-up type: In-patient    Vivianne Master [facility] 03/10/2022, 12:38 PM

## 2022-03-11 ENCOUNTER — Other Ambulatory Visit (HOSPITAL_COMMUNITY): Payer: Self-pay

## 2022-03-11 LAB — GLUCOSE, CAPILLARY: Glucose-Capillary: 79 mg/dL (ref 70–99)

## 2022-03-11 MED ORDER — OXYCODONE HCL 5 MG PO TABS
5.0000 mg | ORAL_TABLET | ORAL | 0 refills | Status: AC | PRN
  Filled 2022-03-11: qty 20, 4d supply, fill #0

## 2022-03-11 NOTE — Lactation Note (Signed)
This note was copied from a baby's chart. Lactation Consultation Note  Patient Name: [name] [id number] Date: 31 years old 03/11/2022 Reason for consult: Follow-up assessment Age:31 years  Spanish interpreter used via video. P4, Mother has chosen to breastfeeding and formula feed. Encouraged breast before formula. Mother concerned about her milk supply.  Reassured mother. Observed latch.  Swallows with stimulation. Mother plans to give formula after breastfeeding. Reviewed engorgement care and monitoring voids/stools. Suggest mother call for assistance as needed.    Feeding Mother's Current Feeding Choice: Breast Milk and Formula  LATCH Score Latch: Grasps breast easily, tongue down, lips flanged, rhythmical sucking.  Audible Swallowing: A few with stimulation  Type of Nipple: Everted at rest and after stimulation  Comfort (Breast/Nipple): Soft / non-tender  Hold (Positioning): Assistance needed to correctly position infant at breast and maintain latch.  LATCH Score: 8   Lactation Tools Discussed/Used    Interventions Interventions: Assisted with latch;Education  Discharge Discharge Education: Engorgement and breast care;Warning signs for feeding baby  Consult Status Consult Status: Follow-up Date: 03/12/22 Follow-up type: In-patient    Vivianne Master [facility] 03/11/2022, 11:00 AM

## 2022-03-16 ENCOUNTER — Ambulatory Visit (INDEPENDENT_AMBULATORY_CARE_PROVIDER_SITE_OTHER): Payer: Self-pay | Admitting: *Deleted

## 2022-03-16 ENCOUNTER — Other Ambulatory Visit: Payer: Self-pay

## 2022-03-16 VITALS — BP 119/70 | HR 72 | Ht 62.0 in | Wt 187.4 lb

## 2022-03-16 DIAGNOSIS — Z0130 Encounter for examination of blood pressure without abnormal findings: Secondary | ICD-10-CM

## 2022-03-16 DIAGNOSIS — Z4889 Encounter for other specified surgical aftercare: Secondary | ICD-10-CM

## 2022-03-16 NOTE — Progress Notes (Signed)
Here for wound check s/p repeat C/S 03/09/22. BP wnl 119/70. Incision CDI with steristrips. Steristrips removed. Incision remains CDI. Reviewed wound care and when to call. Reviewed pospartum appointment and that she has 2 hour gtt same day . She voices understanding. Staci Acosta

## 2022-04-21 ENCOUNTER — Other Ambulatory Visit: Payer: Self-pay

## 2022-04-21 ENCOUNTER — Ambulatory Visit: Payer: Self-pay | Admitting: Family Medicine

## 2022-05-06 ENCOUNTER — Other Ambulatory Visit: Payer: Self-pay

## 2022-05-06 DIAGNOSIS — O24415 Gestational diabetes mellitus in pregnancy, controlled by oral hypoglycemic drugs: Secondary | ICD-10-CM

## 2022-05-12 ENCOUNTER — Other Ambulatory Visit: Payer: Self-pay

## 2022-05-12 ENCOUNTER — Ambulatory Visit: Payer: Self-pay | Admitting: Family Medicine

## 2022-05-21 ENCOUNTER — Encounter: Payer: Self-pay | Admitting: *Deleted
